# Patient Record
Sex: Female | Born: 1960 | Race: White | Hispanic: No | Marital: Married | Smoking: Former smoker
Health system: Southern US, Community
[De-identification: ages and names within clinical notes are randomized; demographics above are authoritative.]

## PROBLEM LIST (undated history)

## (undated) DIAGNOSIS — T7840XA Allergy, unspecified, initial encounter: Secondary | ICD-10-CM

## (undated) HISTORY — PX: INNER EAR SURGERY: SHX679

## (undated) HISTORY — DX: Allergy, unspecified, initial encounter: T78.40XA

## (undated) HISTORY — PX: DILATION AND CURETTAGE OF UTERUS: SHX78

---
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
## 2009-09-10 HISTORY — PX: COLONOSCOPY: SHX174

## 2009-09-10 HISTORY — PX: POLYPECTOMY: SHX149

## 2010-03-14 ENCOUNTER — Encounter (INDEPENDENT_AMBULATORY_CARE_PROVIDER_SITE_OTHER): Payer: Self-pay | Admitting: *Deleted

## 2010-03-22 ENCOUNTER — Encounter (INDEPENDENT_AMBULATORY_CARE_PROVIDER_SITE_OTHER): Payer: Self-pay | Admitting: *Deleted

## 2010-03-24 ENCOUNTER — Ambulatory Visit: Payer: Self-pay | Admitting: Internal Medicine

## 2010-03-31 ENCOUNTER — Ambulatory Visit: Payer: Self-pay | Admitting: Internal Medicine

## 2010-04-04 ENCOUNTER — Encounter: Payer: Self-pay | Admitting: Internal Medicine

## 2010-09-10 LAB — HM COLONOSCOPY

## 2010-10-10 NOTE — Letter (Signed)
Summary: Patient Notice- Polyp Results   Gastroenterology  [address] [place], [place] [address]   Phone: [phone]  Fax: [phone]        April 04, 2010 MRN: [id number]    [facility] [address] [place], [place]  [address]    Dear Ms. Clauson,  I am pleased to inform you that the colon polyp(s) removed during your recent colonoscopy was (were) found to be benign (no cancer detected) upon pathologic examination.The polyps are hyperplastic ( not precancerous)  I recommend you have a repeat colonoscopy examination in 10_ years to look for recurrent polyps, as having colon polyps increases your risk for having recurrent polyps or even colon cancer in the future.  Should you develop new or worsening symptoms of abdominal pain, bowel habit changes or bleeding from the rectum or bowels, please schedule an evaluation with either your primary care physician or with me.  Additional information/recommendations:  _x_ No further action with gastroenterology is needed at this time. Please      follow-up with your primary care physician for your other healthcare      needs.  __ Please call [phone] to schedule a return visit to review your      situation.  __ Please keep your follow-up visit as already scheduled.  __ Continue treatment plan as outlined the day of your exam.  Please call us if you are having persistent problems or have questions about your condition that have not been fully answered at this time.  Sincerely,  Hart Carwin MD  This letter has been electronically signed by your physician.  Appended Document: Patient Notice- Polyp Results letter mailed

## 2010-10-10 NOTE — Miscellaneous (Signed)
Summary: [facility]  Clinical Lists Changes  Medications: Added new medication of MOVIPREP 100 GM  SOLR (PEG-KCL-NACL-NASULF-NA ASC-C) As per prep instructions. - Signed Rx of MOVIPREP 100 GM  SOLR (PEG-KCL-NACL-NASULF-NA ASC-C) As per prep instructions.;  #1 x 0;  Signed;  Entered by: Ezra Sites RN;  Authorized by: Hart Carwin MD;  Method used: Electronically to [facility]  [phone]*, [address] [place], [place], [place]  [address], Ph: [phone] or [phone], Fax: [phone] Allergies: Added new allergy or adverse reaction of PCN Observations: Added new observation of NKA: F (03/24/2010 15:24)    Prescriptions: MOVIPREP 100 GM  SOLR (PEG-KCL-NACL-NASULF-NA ASC-C) As per prep instructions.  #1 x 0   Entered by:   Ezra Sites RN   Authorized by:   Hart Carwin MD   Signed by:   Ezra Sites RN on 03/24/2010   Method used:   Electronically to        [facility]  [phone]* (retail)       [address]       [place], [place]  [address]       Ph: [phone] or [phone]       Fax: [phone]   RxID:   [id number]

## 2010-10-10 NOTE — Letter (Signed)
Summary: Previsit letter  [facility]  [address] [place], [place] [address]   Phone: [phone]  Fax: [phone]       03/14/2010 MRN: [id number]  [facility] [address] [place], [place]  [address]  Dear Ms. Corney,  Welcome to the Gastroenterology Division at [facility].    You are scheduled to see a nurse for your pre-procedure visit on 03/24/2010 at 3:30pm on the 3rd floor at [facility], [address]  We ask that you try to arrive at our office 15 minutes prior to your appointment time to allow for check-in.  Your nurse visit will consist of discussing your medical and surgical history, your immediate family medical history, and your medications.    Please bring a complete list of all your medications or, if you prefer, bring the medication bottles and we will list them.  We will need to be aware of both prescribed and over the counter drugs.  We will need to know exact dosage information as well.  If you are on blood thinners (Coumadin, Plavix, Aggrenox, Ticlid, etc.) please call our office today/prior to your appointment, as we need to consult with your physician about holding your medication.   Please be prepared to read and sign documents such as consent forms, a financial agreement, and acknowledgement forms.  If necessary, and with your consent, a friend or relative is welcome to sit-in on the nurse visit with you.  Please bring your insurance card so that we may make a copy of it.  If your insurance requires a referral to see a specialist, please bring your referral form from your primary care physician.  No co-pay is required for this nurse visit.     If you cannot keep your appointment, please call [phone] to cancel or reschedule prior to your appointment date.  This allows [place] the opportunity to schedule an appointment for another patient in need of care.    Thank you for choosing [facility] for your medical  needs.  We appreciate the opportunity to care for you.  Please visit [place] at our website  to learn more about our practice.                     Sincerely.                                                                                                                   The Gastroenterology Division

## 2010-10-10 NOTE — Letter (Signed)
Summary: [facility] Instructions  [facility]  [address] [place], [place] [address]   Phone: [phone]  Fax: [phone]       Patricia Bryant    August 05, 1961    MRN: [id number]        Procedure Day /Date: Friday 03/31/2010     Arrival Time: 12:30 pm      Procedure Time: 1:30 pm     Location of Procedure:                    _x_  [facility] (4th Floor)                        PREPARATION FOR COLONOSCOPY WITH MOVIPREP   Starting 5 days prior to your procedure Sunday 7/17 do not eat nuts, seeds, popcorn, corn, beans, peas,  salads, or any raw vegetables.  Do not take any fiber supplements (e.g. Metamucil, Citrucel, and Benefiber).  THE DAY BEFORE YOUR PROCEDURE         DATE: Friday 7/21  1.  Drink clear liquids the entire day-NO SOLID FOOD  2.  Do not drink anything colored red or purple.  Avoid juices with pulp.  No orange juice.  3.  Drink at least 64 oz. (8 glasses) of fluid/clear liquids during the day to prevent dehydration and help the prep work efficiently.  CLEAR LIQUIDS INCLUDE: Water Jello Ice Popsicles Tea (sugar ok, no milk/cream) Powdered fruit flavored drinks Coffee (sugar ok, no milk/cream) Gatorade Juice: apple, white grape, white cranberry  Lemonade Clear bullion, consomm, broth Carbonated beverages (any kind) Strained chicken noodle soup Hard Candy                             4.  In the morning, mix first dose of MoviPrep solution:    Empty 1 Pouch A and 1 Pouch B into the disposable container    Add lukewarm drinking water to the top line of the container. Mix to dissolve    Refrigerate (mixed solution should be used within 24 hrs)  5.  Begin drinking the prep at 5:00 p.m. The MoviPrep container is divided by 4 marks.   Every 15 minutes drink the solution down to the next mark (approximately 8 oz) until the full liter is complete.   6.  Follow completed prep with 16 oz of clear liquid of your choice (Nothing  red or purple).  Continue to drink clear liquids until bedtime.  7.  Before going to bed, mix second dose of MoviPrep solution:    Empty 1 Pouch A and 1 Pouch B into the disposable container    Add lukewarm drinking water to the top line of the container. Mix to dissolve    Refrigerate  THE DAY OF YOUR PROCEDURE      DATE: Friday 7/22  Beginning at 8:30 a.m. (5 hours before procedure):         1. Every 15 minutes, drink the solution down to the next mark (approx 8 oz) until the full liter is complete.  2. Follow completed prep with 16 oz. of clear liquid of your choice.    3. You may drink clear liquids until 11:30 am (2 HOURS BEFORE PROCEDURE).   MEDICATION INSTRUCTIONS  Unless otherwise instructed, you should take regular prescription medications with a small sip of water   as early as possible the morning of your procedure.  OTHER INSTRUCTIONS  You will need a responsible adult at least 52 years of age to accompany you and drive you home.   This person must remain in the waiting room during your procedure.  Wear loose fitting clothing that is easily removed.  Leave jewelry and other valuables at home.  However, you may wish to bring a book to read or  an iPod/MP3 player to listen to music as you wait for your procedure to start.  Remove all body piercing jewelry and leave at home.  Total time from sign-in until discharge is approximately 2-3 hours.  You should go home directly after your procedure and rest.  You can resume normal activities the  day after your procedure.  The day of your procedure you should not:   Drive   Make legal decisions   Operate machinery   Drink alcohol   Return to work  You will receive specific instructions about eating, activities and medications before you leave.    The above instructions have been reviewed and explained to me by   Ezra Sites RN  March 24, 2010 3:46 PM    I fully understand and can verbalize  these instructions _____________________________ Date _________

## 2010-10-10 NOTE — Procedures (Signed)
Summary: Colonoscopy  Patient: Patricia Bryant Note: All result statuses are Final unless otherwise noted.  Tests: (1) Colonoscopy (COL)   COL Colonoscopy           DONE     [facility]     [address]     [place], [place]  [address]           COLONOSCOPY PROCEDURE REPORT           PATIENT:  Patricia Bryant, Patricia Bryant  MR#:  [id number]     BIRTHDATE:  Mar 19, 1961, 49 yrs. old  GENDER:  female     ENDOSCOPIST:  Hedwig Morton. Juanda Chance, MD     REF. BY: [email or web address],[place], [place]     PROCEDURE DATE:  03/31/2010     PROCEDURE:  Colonoscopy [address]     ASA CLASS:  Class I     INDICATIONS:  Routine Risk Screening     MEDICATIONS:   Versed 7 mg, Fentanyl 75 mcg           DESCRIPTION OF PROCEDURE:   After the risks benefits and     alternatives of the procedure were thoroughly explained, informed     consent was obtained.  Digital rectal exam was performed and     revealed no rectal masses.   The LB CF-[id number] endoscope     was introduced through the anus and advanced to the cecum, which     was identified by both the appendix and ileocecal valve, without     limitations.  The quality of the prep was excellent, using     MoviPrep.  The instrument was then slowly withdrawn as the colon     was fully examined.     <<PROCEDUREIMAGES>>           FINDINGS:  Two polyps were found. at 5 and 20 cm, 2-3 mm polyps     removed The polyps were removed using cold biopsy forceps (see     image1 and image6).  This was otherwise a normal examination of     the colon (see image7, image5, image4, image3, and image2).     Retroflexed views in the rectum revealed no abnormalities.    The     scope was then withdrawn from the patient and the procedure     completed.           COMPLICATIONS:  None     ENDOSCOPIC IMPRESSION:     1) Two polyps     2) Otherwise normal examination     RECOMMENDATIONS:     1) Await pathology results     2) High fiber diet.     REPEAT EXAM:  In 10 year(s) for.  or 5 years if  polyps adenomatous           ______________________________     Hedwig Morton. Juanda Chance, MD           CC:           n.     eSIGNED:   Hedwig Morton. Brodie at 03/31/2010 02:06 PM           Fransico Him, [phone]  Note: An exclamation mark (!) indicates a result that was not dispersed into the flowsheet. Document Creation Date: 03/31/2010 2:07 PM _______________________________________________________________________  (1) Order result status: Final Collection or observation date-time: 03/31/2010 13:56 Requested date-time:  Receipt date-time:  Reported date-time:  Referring Physician:   Ordering Physician: Lina Sar [id number]) Specimen Source:  Source: Kem Parkinson  Filler Order Number: [id number] Lab site:   Appended Document: Colonoscopy     Procedures Next Due Date:    Colonoscopy: 04/2020

## 2011-10-12 LAB — HM PAP SMEAR

## 2012-06-29 ENCOUNTER — Encounter (HOSPITAL_COMMUNITY): Payer: Self-pay | Admitting: *Deleted

## 2012-06-29 ENCOUNTER — Emergency Department (HOSPITAL_COMMUNITY)
Admission: EM | Admit: 2012-06-29 | Discharge: 2012-06-29 | Disposition: A | Discharge status: Home / Self Care | Payer: BC Managed Care – PPO | Attending: Emergency Medicine | Admitting: Emergency Medicine

## 2012-06-29 DIAGNOSIS — L509 Urticaria, unspecified: Secondary | ICD-10-CM | POA: Insufficient documentation

## 2012-06-29 DIAGNOSIS — Z87891 Personal history of nicotine dependence: Secondary | ICD-10-CM | POA: Insufficient documentation

## 2012-06-29 MED ORDER — FAMOTIDINE 20 MG PO TABS: 10.0000 mg | ORAL_TABLET | Freq: Two times a day (BID) | ORAL | Status: DC

## 2012-06-29 MED ORDER — PREDNISONE 50 MG PO TABS: 50.0000 mg | ORAL_TABLET | Freq: Every day | ORAL | Status: DC

## 2012-06-29 MED ORDER — HYDROXYZINE HCL 25 MG PO TABS: 25.0000 mg | ORAL_TABLET | Freq: Four times a day (QID) | ORAL | Status: DC

## 2012-06-29 MED ORDER — DEXAMETHASONE SODIUM PHOSPHATE 10 MG/ML IJ SOLN
10.0000 mg | Freq: Once | INTRAMUSCULAR | Status: AC
  Administered 2012-06-29: 10 mg via INTRAMUSCULAR
  Filled 2012-06-29: qty 1

## 2012-06-29 NOTE — ED Notes (Addendum)
Pt stayed at [employer] on vacation. Came home on Tuesday and noticed bumps on her back. The bumps itch and since then have been spreading. Start out small about eraser head size and grow to nickle size. Pt has bumps on back, legs, arms, and face. Pt denies pain and denies SOB. Reports she worked out this morning and had no difficulty. Pt has tried hydrocortisone cream with no relief. Took 1 benadryl tablet at 0600

## 2012-07-06 NOTE — ED Provider Notes (Signed)
History     CSN: [id number]  Arrival date & time 06/29/12  1023   First MD Initiated Contact with Patient 06/29/12 1100      Chief Complaint  Patient presents with  . Urticaria    (Consider location/radiation/quality/duration/timing/severity/associated sxs/prior treatment) HPI Patient presents to the ER with hives that began 2 days ago. The patient states that they started on her arms and now involved most of her body. The patient states that they slept at a hotel and the next day these hive like areas developed. The patient states that she did not take anything prior to arrival. The patient denies chest pain, SOB, nausea, vomiting, weakness, headache, wheezing, fever, or cough. The patient states that she has not started any new medications or had new exposures to perfume, lotions, soaps or detergents. Patient states that she does have itching associated with the hives. History reviewed. No pertinent past medical history.  Past Surgical History  Procedure Date  . Inner ear surgery     History reviewed. No pertinent family history.  History  Substance Use Topics  . Smoking status: Former Smoker -- 0.5 packs/day for 15 years    Types: Cigarettes  . Smokeless tobacco: Not on file  . Alcohol Use: Yes    OB History    Grav Para Term Preterm Abortions TAB SAB Ect Mult Living                  Review of Systems All other systems negative except as documented in the HPI. All pertinent positives and negatives as reviewed in the HPI.  Allergies  Penicillins  Home Medications   Current Outpatient Rx  Name Route Sig Dispense Refill  . FAMOTIDINE 20 MG PO TABS Oral Take 0.5 tablets (10 mg total) by mouth 2 (two) times daily. 30 tablet 0  . HYDROXYZINE HCL 25 MG PO TABS Oral Take 1 tablet (25 mg total) by mouth every 6 (six) hours. 12 tablet 0  . PREDNISONE 50 MG PO TABS Oral Take 1 tablet (50 mg total) by mouth daily. 7 tablet 0    BP 163/100  Pulse 81  Temp 98 F (36.7  C) (Oral)  Resp 16  SpO2 100%  Physical Exam  Nursing note and vitals reviewed. Constitutional: She is oriented to person, place, and time. She appears well-developed and well-nourished. No distress.  HENT:  Head: Normocephalic and atraumatic.  Mouth/Throat: Oropharynx is clear and moist.  Neck: Normal range of motion. Neck supple.  Cardiovascular: Normal rate, regular rhythm and normal heart sounds.   Pulmonary/Chest: Effort normal and breath sounds normal. No respiratory distress. She has no wheezes.  Neurological: She is alert and oriented to person, place, and time.  Skin: Skin is warm and dry. No erythema.       Patient has hives to arms, chest, abdomen back and legs.     ED Course  Procedures (including critical care time)  Labs Reviewed - No data to display No results found.   1. Hives     Will be treated for hives and allergic reaction. The patient exposure could have been the detergent used on the bedding or towels at the hotel. Told to return here as needed. Follow up with her PCP. Given steroids and told to use benadryl as well.   MDM          Carlyle Dolly, PA-C 07/06/12 1528

## 2012-07-13 NOTE — ED Provider Notes (Signed)
Medical screening examination/treatment/procedure(s) were performed by non-physician practitioner and as supervising physician I was immediately available for consultation/collaboration.  Hurman Horn, MD 07/13/12 [phone]

## 2013-03-24 ENCOUNTER — Ambulatory Visit (INDEPENDENT_AMBULATORY_CARE_PROVIDER_SITE_OTHER): Payer: Managed Care, Other (non HMO) | Admitting: Internal Medicine

## 2013-03-24 ENCOUNTER — Encounter: Payer: Self-pay | Admitting: Internal Medicine

## 2013-03-24 ENCOUNTER — Other Ambulatory Visit (INDEPENDENT_AMBULATORY_CARE_PROVIDER_SITE_OTHER): Payer: Managed Care, Other (non HMO)

## 2013-03-24 VITALS — BP 118/82 | HR 61 | Temp 97.0°F | Ht 64.5 in | Wt 125.0 lb

## 2013-03-24 DIAGNOSIS — Z130 Encounter for screening for diseases of the blood and blood-forming organs and certain disorders involving the immune mechanism: Secondary | ICD-10-CM

## 2013-03-24 DIAGNOSIS — Z13220 Encounter for screening for lipoid disorders: Secondary | ICD-10-CM

## 2013-03-24 DIAGNOSIS — Z1239 Encounter for other screening for malignant neoplasm of breast: Secondary | ICD-10-CM

## 2013-03-24 DIAGNOSIS — Z0000 Encounter for general adult medical examination without abnormal findings: Secondary | ICD-10-CM

## 2013-03-24 DIAGNOSIS — Z1321 Encounter for screening for nutritional disorder: Secondary | ICD-10-CM

## 2013-03-24 DIAGNOSIS — Z1329 Encounter for screening for other suspected endocrine disorder: Secondary | ICD-10-CM

## 2013-03-24 DIAGNOSIS — Z131 Encounter for screening for diabetes mellitus: Secondary | ICD-10-CM

## 2013-03-24 DIAGNOSIS — R7989 Other specified abnormal findings of blood chemistry: Secondary | ICD-10-CM

## 2013-03-24 LAB — COMPREHENSIVE METABOLIC PANEL
AST: 24 U/L (ref 0–37)
Albumin: 4.5 g/dL (ref 3.5–5.2)
Alkaline Phosphatase: 69 U/L (ref 39–117)
BUN: 14 mg/dL (ref 6–23)
Glucose, Bld: 87 mg/dL (ref 70–99)
Potassium: 4.7 mEq/L (ref 3.5–5.1)
Total Bilirubin: 1.3 mg/dL — ABNORMAL HIGH (ref 0.3–1.2)

## 2013-03-24 LAB — CBC
HCT: 39.6 % (ref 36.0–46.0)
MCHC: 33.8 g/dL (ref 30.0–36.0)
MCV: 90.4 fl (ref 78.0–100.0)
Platelets: 261 10*3/uL (ref 150.0–400.0)
RBC: 4.38 Mil/uL (ref 3.87–5.11)

## 2013-03-24 LAB — LIPID PANEL
Cholesterol: 234 mg/dL — ABNORMAL HIGH (ref 0–200)
HDL: 105.3 mg/dL (ref >39.00)
Total CHOL/HDL Ratio: 2
Triglycerides: 36 mg/dL (ref 0.0–149.0)
VLDL: 7.2 mg/dL (ref 0.0–40.0)

## 2013-03-24 NOTE — Patient Instructions (Signed)

## 2013-03-24 NOTE — Progress Notes (Signed)
HPI  Pt presents to the clinic today to establish care. She in transferring care from her PCP in [place]. She has no concerns today.  Flu: 06/2012 Tetanus: 2012 Mammogram: 2013 Pap smear: 2013 LMP: post menopausal Eye doctor: as needed Dentist: yearly  Past Medical History  Diagnosis Date  . Allergy     No current outpatient prescriptions on file.   No current facility-administered medications for this visit.    Allergies  Allergen Reactions  . Penicillins     REACTION: hives, ankles swell    Family History  Problem Relation Age of Onset  . Colon cancer Father   . Heart attack Sister   . Heart disease Sister     History   Social History  . Marital Status: Married    Spouse Name: N/A    Number of Children: N/A  . Years of Education: 16   Occupational History  . Manager    Social History Main Topics  . Smoking status: Former Smoker -- 0.50 packs/day for 15 years    Types: Cigarettes  . Smokeless tobacco: Never Used  . Alcohol Use: Yes  . Drug Use: No  . Sexually Active: Yes   Other Topics Concern  . Not on file   Social History Narrative   Regular exercise-yes   Caffeine use-no    ROS:  Constitutional: Denies fever, malaise, fatigue, headache or abrupt weight changes.  HEENT: Denies eye pain, eye redness, ear pain, ringing in the ears, wax buildup, runny nose, nasal congestion, bloody nose, or sore throat. Respiratory: Denies difficulty breathing, shortness of breath, cough or sputum production.   Cardiovascular: Denies chest pain, chest tightness, palpitations or swelling in the hands or feet.  Gastrointestinal: Denies abdominal pain, bloating, constipation, diarrhea or blood in the stool.  GU: Denies frequency, urgency, pain with urination, blood in urine, odor or discharge. Musculoskeletal: Denies decrease in range of motion, difficulty with gait, muscle pain or joint pain and swelling.  Skin: Denies redness, rashes, lesions or ulcercations.   Neurological: Denies dizziness, difficulty with memory, difficulty with speech or problems with balance and coordination.   No other specific complaints in a complete review of systems (except as listed in HPI above).  PE:  BP 118/82  Pulse 61  Temp(Src) 97 F (36.1 C) (Oral)  Ht 5' 4.5" (1.638 m)  Wt 125 lb (56.7 kg)  BMI 21.13 kg/m2  SpO2 96% Wt Readings from Last 3 Encounters:  03/24/13 125 lb (56.7 kg)    General: Appears her stated age, well developed, well nourished in NAD. HEENT: Head: normal shape and size; Eyes: sclera white, no icterus, conjunctiva pink, PERRLA and EOMs intact; Ears: Tm's gray and intact, normal light reflex; Nose: mucosa pink and moist, septum midline; Throat/Mouth: Teeth present, mucosa pink and moist, no lesions or ulcerations noted.  Neck: Normal range of motion. Neck supple, trachea midline. No massses, lumps or thyromegaly present.  Cardiovascular: Normal rate and rhythm. S1,S2 noted.  No murmur, rubs or gallops noted. No JVD or BLE edema. No carotid bruits noted. Pulmonary/Chest: Normal effort and positive vesicular breath sounds. No respiratory distress. No wheezes, rales or ronchi noted.  Abdomen: Soft and nontender. Normal bowel sounds, no bruits noted. No distention or masses noted. Liver, spleen and kidneys non palpable. Musculoskeletal: Normal range of motion. No signs of joint swelling. No difficulty with gait.  Neurological: Alert and oriented. Cranial nerves II-XII intact. Coordination normal. +DTRs bilaterally. Psychiatric: Mood and affect normal. Behavior is normal. Judgment and  thought content normal.      Assessment and Plan:  Preventative Health Maintenance:  Will obtain labs today Will set up mammogram Set up your pap smear with me  RTC in 1 year or sooner

## 2013-03-25 LAB — VITAMIN D 25 HYDROXY (VIT D DEFICIENCY, FRACTURES): Vit D, 25-Hydroxy: 53 ng/mL (ref 30–89)

## 2013-06-08 ENCOUNTER — Other Ambulatory Visit (HOSPITAL_COMMUNITY): Payer: Self-pay | Admitting: Dietician

## 2013-06-08 DIAGNOSIS — Z1231 Encounter for screening mammogram for malignant neoplasm of breast: Secondary | ICD-10-CM

## 2013-07-02 ENCOUNTER — Ambulatory Visit (HOSPITAL_COMMUNITY): Payer: Managed Care, Other (non HMO)

## 2013-07-16 ENCOUNTER — Other Ambulatory Visit: Payer: Self-pay

## 2013-07-21 ENCOUNTER — Ambulatory Visit (HOSPITAL_COMMUNITY)
Admission: RE | Admit: 2013-07-21 | Discharge: 2013-07-21 | Disposition: A | Discharge status: Home / Self Care | Payer: Managed Care, Other (non HMO) | Source: Ambulatory Visit | Attending: Internal Medicine | Admitting: Internal Medicine

## 2013-07-21 DIAGNOSIS — Z1239 Encounter for other screening for malignant neoplasm of breast: Secondary | ICD-10-CM

## 2013-07-21 DIAGNOSIS — Z1231 Encounter for screening mammogram for malignant neoplasm of breast: Secondary | ICD-10-CM | POA: Insufficient documentation

## 2014-01-08 ENCOUNTER — Other Ambulatory Visit (HOSPITAL_COMMUNITY)
Admission: RE | Admit: 2014-01-08 | Discharge: 2014-01-08 | Disposition: A | Discharge status: Home / Self Care | Payer: Managed Care, Other (non HMO) | Source: Ambulatory Visit | Attending: Internal Medicine | Admitting: Internal Medicine

## 2014-01-08 ENCOUNTER — Encounter: Payer: Self-pay | Admitting: Internal Medicine

## 2014-01-08 ENCOUNTER — Ambulatory Visit (INDEPENDENT_AMBULATORY_CARE_PROVIDER_SITE_OTHER): Payer: Managed Care, Other (non HMO) | Admitting: Internal Medicine

## 2014-01-08 VITALS — BP 104/68 | HR 74 | Temp 97.9°F | Ht 63.75 in | Wt 126.0 lb

## 2014-01-08 DIAGNOSIS — Z0000 Encounter for general adult medical examination without abnormal findings: Secondary | ICD-10-CM

## 2014-01-08 DIAGNOSIS — Z124 Encounter for screening for malignant neoplasm of cervix: Secondary | ICD-10-CM

## 2014-01-08 DIAGNOSIS — R87810 Cervical high risk human papillomavirus (HPV) DNA test positive: Secondary | ICD-10-CM | POA: Insufficient documentation

## 2014-01-08 DIAGNOSIS — Z1151 Encounter for screening for human papillomavirus (HPV): Secondary | ICD-10-CM | POA: Insufficient documentation

## 2014-01-08 LAB — COMPREHENSIVE METABOLIC PANEL
ALBUMIN: 4.7 g/dL (ref 3.5–5.2)
ALK PHOS: 69 U/L (ref 39–117)
ALT: 19 U/L (ref 0–35)
AST: 23 U/L (ref 0–37)
BUN: 14 mg/dL (ref 6–23)
CO2: 29 mEq/L (ref 19–32)
Calcium: 10.1 mg/dL (ref 8.4–10.5)
Chloride: 103 mEq/L (ref 96–112)
Creatinine, Ser: 0.8 mg/dL (ref 0.4–1.2)
GFR: 76.89 mL/min (ref >60.00)
Glucose, Bld: 87 mg/dL (ref 70–99)
POTASSIUM: 5.2 meq/L — AB (ref 3.5–5.1)
SODIUM: 140 meq/L (ref 135–145)
TOTAL PROTEIN: 7.6 g/dL (ref 6.0–8.3)
Total Bilirubin: 1.1 mg/dL (ref 0.3–1.2)

## 2014-01-08 LAB — HEMOGLOBIN A1C: HEMOGLOBIN A1C: 5.4 % (ref 4.6–6.5)

## 2014-01-08 LAB — LIPID PANEL
CHOL/HDL RATIO: 2
CHOLESTEROL: 232 mg/dL — AB (ref 0–200)
HDL: 117.5 mg/dL (ref >39.00)
LDL CALC: 100 mg/dL — AB (ref 0–99)
Triglycerides: 73 mg/dL (ref 0.0–149.0)
VLDL: 14.6 mg/dL (ref 0.0–40.0)

## 2014-01-08 LAB — TSH: TSH: 0.86 u[IU]/mL (ref 0.35–5.50)

## 2014-01-08 LAB — CBC
HCT: 41 % (ref 36.0–46.0)
Hemoglobin: 13.7 g/dL (ref 12.0–15.0)
MCHC: 33.3 g/dL (ref 30.0–36.0)
MCV: 89 fl (ref 78.0–100.0)
Platelets: 259 10*3/uL (ref 150.0–400.0)
RBC: 4.61 Mil/uL (ref 3.87–5.11)
RDW: 14.2 % (ref 11.5–14.6)
WBC: 5.6 10*3/uL (ref 4.5–10.5)

## 2014-01-08 NOTE — Patient Instructions (Addendum)

## 2014-01-08 NOTE — Progress Notes (Signed)
Subjective:    Patient ID: Patricia Bryant, female    DOB: 06/20/1961, 53 y.o.   MRN: [id number]  HPI  Pt presents to the clinic today for her annual physical exam and pap smear. She has no concerns today.  Flu: 10/13 Tetanus: 2012 LMP: post menopausal Pap Smear: 2013 Mammogram: 07/2013 Colon Screening: 2012 Eye Doctor: yearly Dentist: biannually  Review of Systems      Past Medical History  Diagnosis Date  . Allergy     Current Outpatient Prescriptions  Medication Sig Dispense Refill  . Multiple Vitamin (MULTIVITAMIN) tablet Take 1 tablet by mouth daily.       No current facility-administered medications for this visit.    Allergies  Allergen Reactions  . Penicillins     REACTION: hives, ankles swell    Family History  Problem Relation Age of Onset  . Colon cancer Father   . Heart attack Sister   . Heart disease Sister     History   Social History  . Marital Status: Married    Spouse Name: N/A    Number of Children: N/A  . Years of Education: 16   Occupational History  . Manager    Social History Main Topics  . Smoking status: Former Smoker -- 0.50 packs/day for 15 years    Types: Cigarettes  . Smokeless tobacco: Never Used  . Alcohol Use: Yes     Comment: occasional  . Drug Use: No  . Sexual Activity: Yes   Other Topics Concern  . Not on file   Social History Narrative   Regular exercise-yes   Caffeine use-no     Constitutional: Denies fever, malaise, fatigue, headache or abrupt weight changes.  HEENT: Denies eye pain, eye redness, ear pain, ringing in the ears, wax buildup, runny nose, nasal congestion, bloody nose, or sore throat. Respiratory: Denies difficulty breathing, shortness of breath, cough or sputum production.   Cardiovascular: Denies chest pain, chest tightness, palpitations or swelling in the hands or feet.  Gastrointestinal: Denies abdominal pain, bloating, constipation, diarrhea or blood in the stool.  GU: Denies  urgency, frequency, pain with urination, burning sensation, blood in urine, odor or discharge. Musculoskeletal: Denies decrease in range of motion, difficulty with gait, muscle pain or joint pain and swelling.  Skin: Denies redness, rashes, lesions or ulcercations.  Neurological: Denies dizziness, difficulty with memory, difficulty with speech or problems with balance and coordination.   No other specific complaints in a complete review of systems (except as listed in HPI above).  Objective:   Physical Exam   BP 104/68  Pulse 74  Temp(Src) 97.9 F (36.6 C) (Oral)  Ht 5' 3.75" (1.619 m)  Wt 126 lb (57.153 kg)  BMI 21.80 kg/m2  SpO2 98% Wt Readings from Last 3 Encounters:  01/08/14 126 lb (57.153 kg)  03/24/13 125 lb (56.7 kg)    Constitutional:  Alert, oriented x 4, well developed, well nourished in no apparent distress. Skin: Skin is warm and dry.  No erythema, lesion or ulceration noted. HEENT: Head: normal shape and size; Eyes: sclera white, no icterus, conjunctiva pink, PERRLA and EOMs intact; Ears: Tm's gray and intact, normal light reflex; Nose: mucosa pink and moist, septum midline; Throat/Mouth: Teeth present, , mucosa pink and moist, no lesions or ulcerations noted. Neck: Normal range of motion. Neck supple, trachea midline. No massses, lumps present. Thyromegaly noted. Cardiovascular: Normal rate and rhythm. S1,S2 noted.  No murmur, rubs or gallops noted. No JVD or BLE edema. No  carotid bruits noted. Pulmonary/Chest: Normal effort and positive vesicular breath sounds. No respiratory distress. No wheezes, rales or ronchi noted.  Abdomen: Soft and nontender. Normal bowel sounds, no bruits noted. No distention or masses noted. Liver, spleen and kidneys non palpable. Genitourinary: Normal female anatomy. Uterus midline, anterior and soft. No CMT or discharge noted. Adenexa non palpable. Breast with fibrocystic changes, no masses.  Musculoskeletal: Normal range of motion. Patient  exhibits no effusions.  Neurological: Alert and oriented. Cranial nerves II-XII intact. Coordination normal. +DTRs bilaterally. Psychiatric: She has a normal mood and affect. Behavior is normal. Judgment and thought content normal.    BMET    Component Value Date/Time   NA 140 03/24/2013 1528   K 4.7 03/24/2013 1528   CL 102 03/24/2013 1528   CO2 32 03/24/2013 1528   GLUCOSE 87 03/24/2013 1528   BUN 14 03/24/2013 1528   CREATININE 0.8 03/24/2013 1528   CALCIUM 10.1 03/24/2013 1528    Lipid Panel     Component Value Date/Time   CHOL 234* 03/24/2013 1528   TRIG 36.0 03/24/2013 1528   HDL 105.30 03/24/2013 1528   CHOLHDL 2 03/24/2013 1528   VLDL 7.2 03/24/2013 1528    CBC    Component Value Date/Time   WBC 4.8 03/24/2013 1528   RBC 4.38 03/24/2013 1528   HGB 13.4 03/24/2013 1528   HCT 39.6 03/24/2013 1528   PLT 261.0 03/24/2013 1528   MCV 90.4 03/24/2013 1528   MCHC 33.8 03/24/2013 1528   RDW 13.6 03/24/2013 1528    Hgb A1C Lab Results  Component Value Date   HGBA1C 5.8 03/24/2013        Assessment & Plan:   Preventative Health Maintenance:  All HM UTD Will obtain screening labs today Pap smear obtained-will call you with the results  RTC in 1 year or sooner if needed

## 2014-01-08 NOTE — Addendum Note (Signed)
Addended by: Littie DeedsEVONTENNO, Amra Shukla Y on: 01/08/2014 10:00 AM   Modules accepted: Orders

## 2014-01-18 ENCOUNTER — Encounter: Payer: Self-pay | Admitting: Internal Medicine

## 2015-01-14 ENCOUNTER — Encounter: Payer: Self-pay | Admitting: Internal Medicine

## 2015-01-14 ENCOUNTER — Other Ambulatory Visit (HOSPITAL_COMMUNITY)
Admission: RE | Admit: 2015-01-14 | Discharge: 2015-01-14 | Disposition: A | Discharge status: Home / Self Care | Payer: Managed Care, Other (non HMO) | Source: Ambulatory Visit | Attending: Internal Medicine | Admitting: Internal Medicine

## 2015-01-14 ENCOUNTER — Ambulatory Visit (INDEPENDENT_AMBULATORY_CARE_PROVIDER_SITE_OTHER): Payer: Managed Care, Other (non HMO) | Admitting: Internal Medicine

## 2015-01-14 VITALS — BP 120/80 | HR 70 | Temp 97.8°F | Ht 63.75 in | Wt 127.5 lb

## 2015-01-14 DIAGNOSIS — Z124 Encounter for screening for malignant neoplasm of cervix: Secondary | ICD-10-CM

## 2015-01-14 DIAGNOSIS — Z0000 Encounter for general adult medical examination without abnormal findings: Secondary | ICD-10-CM

## 2015-01-14 DIAGNOSIS — Z01419 Encounter for gynecological examination (general) (routine) without abnormal findings: Secondary | ICD-10-CM | POA: Diagnosis present

## 2015-01-14 LAB — COMPREHENSIVE METABOLIC PANEL
ALT: 12 U/L (ref 0–35)
AST: 16 U/L (ref 0–37)
Albumin: 4.4 g/dL (ref 3.5–5.2)
Alkaline Phosphatase: 77 U/L (ref 39–117)
BILIRUBIN TOTAL: 0.9 mg/dL (ref 0.2–1.2)
BUN: 6 mg/dL (ref 6–23)
CALCIUM: 9.7 mg/dL (ref 8.4–10.5)
CO2: 35 mEq/L — ABNORMAL HIGH (ref 19–32)
CREATININE: 0.74 mg/dL (ref 0.40–1.20)
Chloride: 101 mEq/L (ref 96–112)
GFR: 86.24 mL/min (ref >60.00)
Glucose, Bld: 86 mg/dL (ref 70–99)
Potassium: 4.8 mEq/L (ref 3.5–5.1)
Sodium: 138 mEq/L (ref 135–145)
Total Protein: 7.4 g/dL (ref 6.0–8.3)

## 2015-01-14 LAB — LIPID PANEL
CHOLESTEROL: 220 mg/dL — AB (ref 0–200)
HDL: 121 mg/dL (ref >39.00)
LDL Cholesterol: 84 mg/dL (ref 0–99)
NonHDL: 99
Total CHOL/HDL Ratio: 2
Triglycerides: 76 mg/dL (ref 0.0–149.0)
VLDL: 15.2 mg/dL (ref 0.0–40.0)

## 2015-01-14 LAB — CBC
HCT: 38.4 % (ref 36.0–46.0)
Hemoglobin: 13.1 g/dL (ref 12.0–15.0)
MCHC: 34.1 g/dL (ref 30.0–36.0)
MCV: 85.9 fl (ref 78.0–100.0)
Platelets: 245 10*3/uL (ref 150.0–400.0)
RBC: 4.47 Mil/uL (ref 3.87–5.11)
RDW: 14.1 % (ref 11.5–15.5)
WBC: 3.7 10*3/uL — AB (ref 4.0–10.5)

## 2015-01-14 NOTE — Addendum Note (Signed)
Addended by: Lorre MunroeBAITY, REGINA W on: 01/14/2015 03:01 PM   Modules accepted: Kipp BroodSmartSet

## 2015-01-14 NOTE — Progress Notes (Signed)
Pre visit review using our clinic review tool, if applicable. No additional management support is needed unless otherwise documented below in the visit note. 

## 2015-01-14 NOTE — Patient Instructions (Signed)

## 2015-01-14 NOTE — Progress Notes (Signed)
Subjective:    Patient ID: Patricia Bryant, female    DOB: 10/02/1960, 54 y.o.   MRN: [id number]  HPI  Pt presents to the clinic today for her annual exam.  Flu: 2014 Tetanus: 2012 Pap Smear: 01/2014, normal but HPV detected Mammogram: 07/2013, she will call to schedule Colon Screening: 2012, normal Vision Screening: yearly Dentist: biannually  Diet: She is consuming a vegan diet, she does consume some cheese. She is taking a B12 supplement. Exercise: not currently exercising but was doing [place] about 2 months ago  Review of Systems      Past Medical History  Diagnosis Date  . Allergy     Current Outpatient Prescriptions  Medication Sig Dispense Refill  . Multiple Vitamin (MULTIVITAMIN) tablet Take 1 tablet by mouth daily.     No current facility-administered medications for this visit.    Allergies  Allergen Reactions  . Penicillins     REACTION: hives, ankles swell    Family History  Problem Relation Age of Onset  . Colon cancer Father   . Heart attack Sister   . Heart disease Sister     History   Social History  . Marital Status: Married    Spouse Name: N/A  . Number of Children: N/A  . Years of Education: 16   Occupational History  . Manager    Social History Main Topics  . Smoking status: Former Smoker -- 0.50 packs/day for 15 years    Types: Cigarettes  . Smokeless tobacco: Never Used  . Alcohol Use: Yes     Comment: occasional  . Drug Use: No  . Sexual Activity: Yes   Other Topics Concern  . Not on file   Social History Narrative   Regular exercise-yes   Caffeine use-no     Constitutional: Denies fever, malaise, fatigue, headache or abrupt weight changes.  HEENT: Denies eye pain, eye redness, ear pain, ringing in the ears, wax buildup, runny nose, nasal congestion, bloody nose, or sore throat. Respiratory: Denies difficulty breathing, shortness of breath, cough or sputum production.   Cardiovascular: Denies chest pain, chest  tightness, palpitations or swelling in the hands or feet.  Gastrointestinal: Denies abdominal pain, bloating, constipation, diarrhea or blood in the stool.  GU: Denies urgency, frequency, pain with urination, burning sensation, blood in urine, odor or discharge. Musculoskeletal: Denies decrease in range of motion, difficulty with gait, muscle pain or joint pain and swelling.  Skin: Denies redness, rashes, lesions or ulcercations.  Neurological: Denies dizziness, difficulty with memory, difficulty with speech or problems with balance and coordination.  Psych: Denies anxiety, depression, SI/HI.  No other specific complaints in a complete review of systems (except as listed in HPI above).   Objective:   Physical Exam  BP 120/80 mmHg  Pulse 70  Temp(Src) 97.8 F (36.6 C) (Oral)  Ht 5' 3.75" (1.619 m)  Wt 127 lb 8 oz (57.834 kg)  BMI 22.06 kg/m2 Wt Readings from Last 3 Encounters:  01/14/15 127 lb 8 oz (57.834 kg)  01/08/14 126 lb (57.153 kg)  03/24/13 125 lb (56.7 kg)    Constitutional:  Alert, oriented x 4, well developed, well nourished in no apparent distress. Skin: Skin is warm and dry.  No erythema, lesion or ulceration noted. HEENT: Head: normal shape and size; Eyes: sclera white, no icterus, conjunctiva pink, PERRLA and EOMs intact; Ears: Tm's gray and intact, normal light reflex; Throat/Mouth: Teeth present, mucosa pink and moist, no lesions or ulcerations noted. Neck: Neck supple, trachea  midline. No massses, lumps or thyromegaly present.  Cardiovascular: Normal rate and rhythm. S1,S2 noted.  No murmur, rubs or gallops noted. No JVD or BLE edema. No carotid bruits noted. Pulmonary/Chest: Normal effort and positive vesicular breath sounds. No respiratory distress. No wheezes, rales or ronchi noted.  Abdomen: Soft and nontender. Normal bowel sounds, no bruits noted. No distention or masses noted. Liver, spleen and kidneys non palpable. Genitourinary: Normal female anatomy. Uterus  midline, anterior and soft. No CMT or discharge noted. Adenexa non palpable. Breast without lumps or masses.  Musculoskeletal: Normal range of motion. Strength 5/5 BUE/BLE.  Neurological: Alert and oriented. Cranial nerves II-XII grossly intact. Coordination normal.  Psychiatric: She has a normal mood and affect. Behavior is normal. Judgment and thought content normal.    BMET    Component Value Date/Time   NA 140 01/08/2014 0845   K 5.2* 01/08/2014 0845   CL 103 01/08/2014 0845   CO2 29 01/08/2014 0845   GLUCOSE 87 01/08/2014 0845   BUN 14 01/08/2014 0845   CREATININE 0.8 01/08/2014 0845   CALCIUM 10.1 01/08/2014 0845    Lipid Panel     Component Value Date/Time   CHOL 232* 01/08/2014 0845   TRIG 73.0 01/08/2014 0845   HDL 117.50 01/08/2014 0845   CHOLHDL 2 01/08/2014 0845   VLDL 14.6 01/08/2014 0845   LDLCALC 100* 01/08/2014 0845    CBC    Component Value Date/Time   WBC 5.6 01/08/2014 0845   RBC 4.61 01/08/2014 0845   HGB 13.7 01/08/2014 0845   HCT 41.0 01/08/2014 0845   PLT 259.0 01/08/2014 0845   MCV 89.0 01/08/2014 0845   MCHC 33.3 01/08/2014 0845   RDW 14.2 01/08/2014 0845    Hgb A1C Lab Results  Component Value Date   HGBA1C 5.4 01/08/2014        Assessment & Plan:   Preventative Health Maintenance:  Encouraged her to keep up with the diet and exercise Pap smear today- will call you with the results She will call and schedule her mammogram We will give her a flu shot in the fall Will check CBC, CMET and Lipid Profile Will obtain ECG- normal Not due yet for repeat colon screening  RTC in 1 year or sooner if needed

## 2015-01-14 NOTE — Addendum Note (Signed)
Addended by: Damita LackLORING, DONNA S on: 01/14/2015 03:02 PM   Modules accepted: Orders

## 2015-01-18 LAB — CYTOLOGY - PAP

## 2015-01-20 ENCOUNTER — Encounter: Payer: Managed Care, Other (non HMO) | Admitting: Internal Medicine

## 2015-02-21 ENCOUNTER — Other Ambulatory Visit (HOSPITAL_COMMUNITY): Payer: Self-pay | Admitting: Nurse Practitioner

## 2015-02-21 ENCOUNTER — Other Ambulatory Visit: Payer: Self-pay | Admitting: Internal Medicine

## 2015-02-21 DIAGNOSIS — Z1231 Encounter for screening mammogram for malignant neoplasm of breast: Secondary | ICD-10-CM

## 2015-03-01 ENCOUNTER — Ambulatory Visit (HOSPITAL_COMMUNITY)
Admission: RE | Admit: 2015-03-01 | Discharge: 2015-03-01 | Disposition: A | Discharge status: Home / Self Care | Payer: Managed Care, Other (non HMO) | Source: Ambulatory Visit | Attending: Internal Medicine | Admitting: Internal Medicine

## 2015-03-01 DIAGNOSIS — Z1231 Encounter for screening mammogram for malignant neoplasm of breast: Secondary | ICD-10-CM | POA: Insufficient documentation

## 2015-05-02 ENCOUNTER — Encounter: Payer: Self-pay | Admitting: Internal Medicine

## 2015-09-11 DIAGNOSIS — M8580 Other specified disorders of bone density and structure, unspecified site: Secondary | ICD-10-CM

## 2015-09-11 HISTORY — DX: Other specified disorders of bone density and structure, unspecified site: M85.80

## 2016-05-20 IMAGING — MG MM DIGITAL SCREENING BILAT
4 series · 4 of 4 positions shown · non-contrast
Comparison: Previous exam(s).

CLINICAL DATA: Screening.

EXAM:
DIGITAL SCREENING BILATERAL MAMMOGRAM WITH CAD

[R CC]
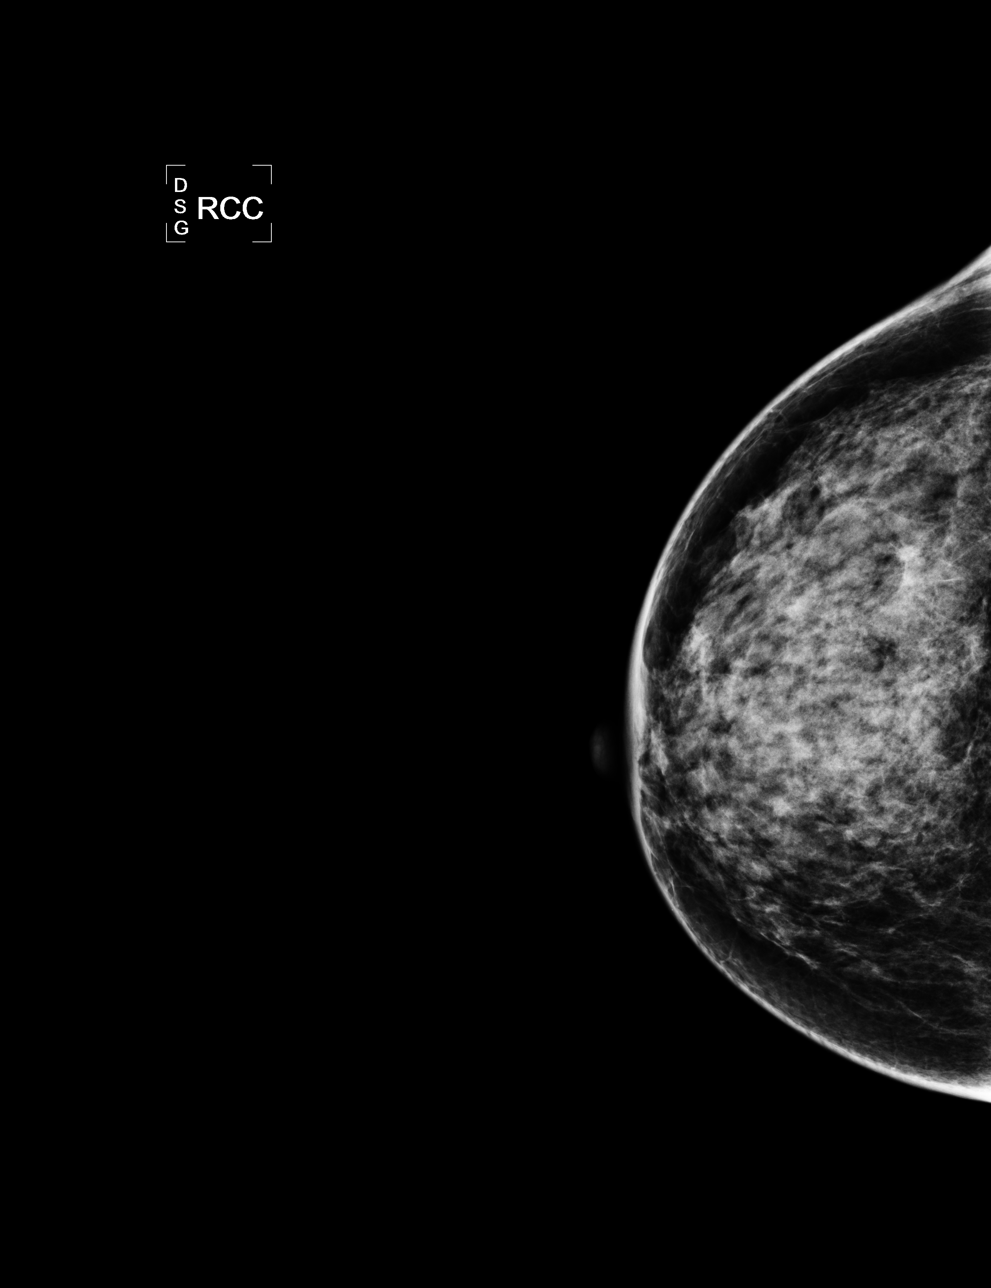

[R MLO]
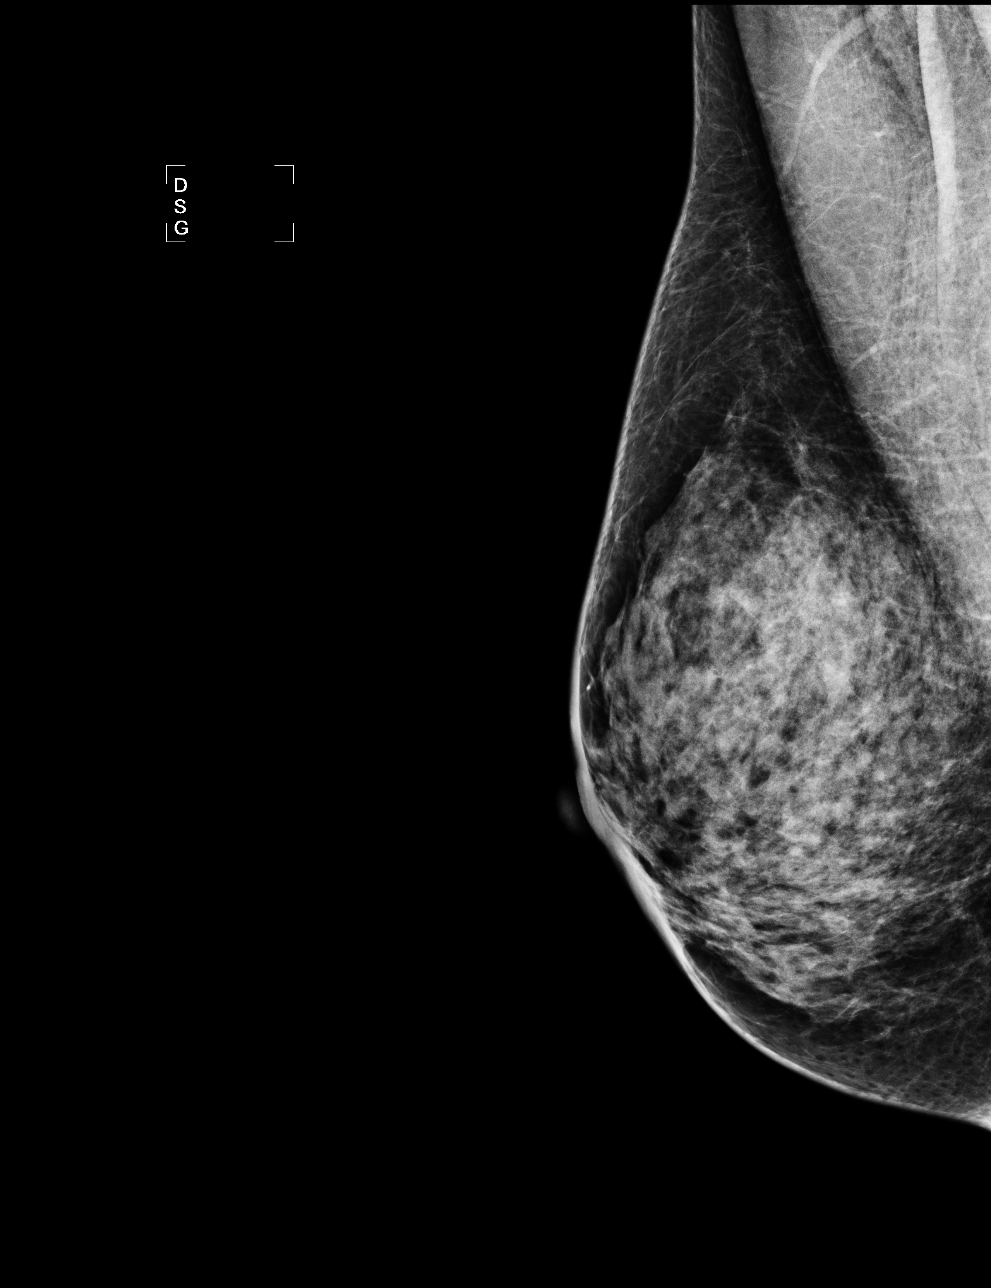

[L CC]
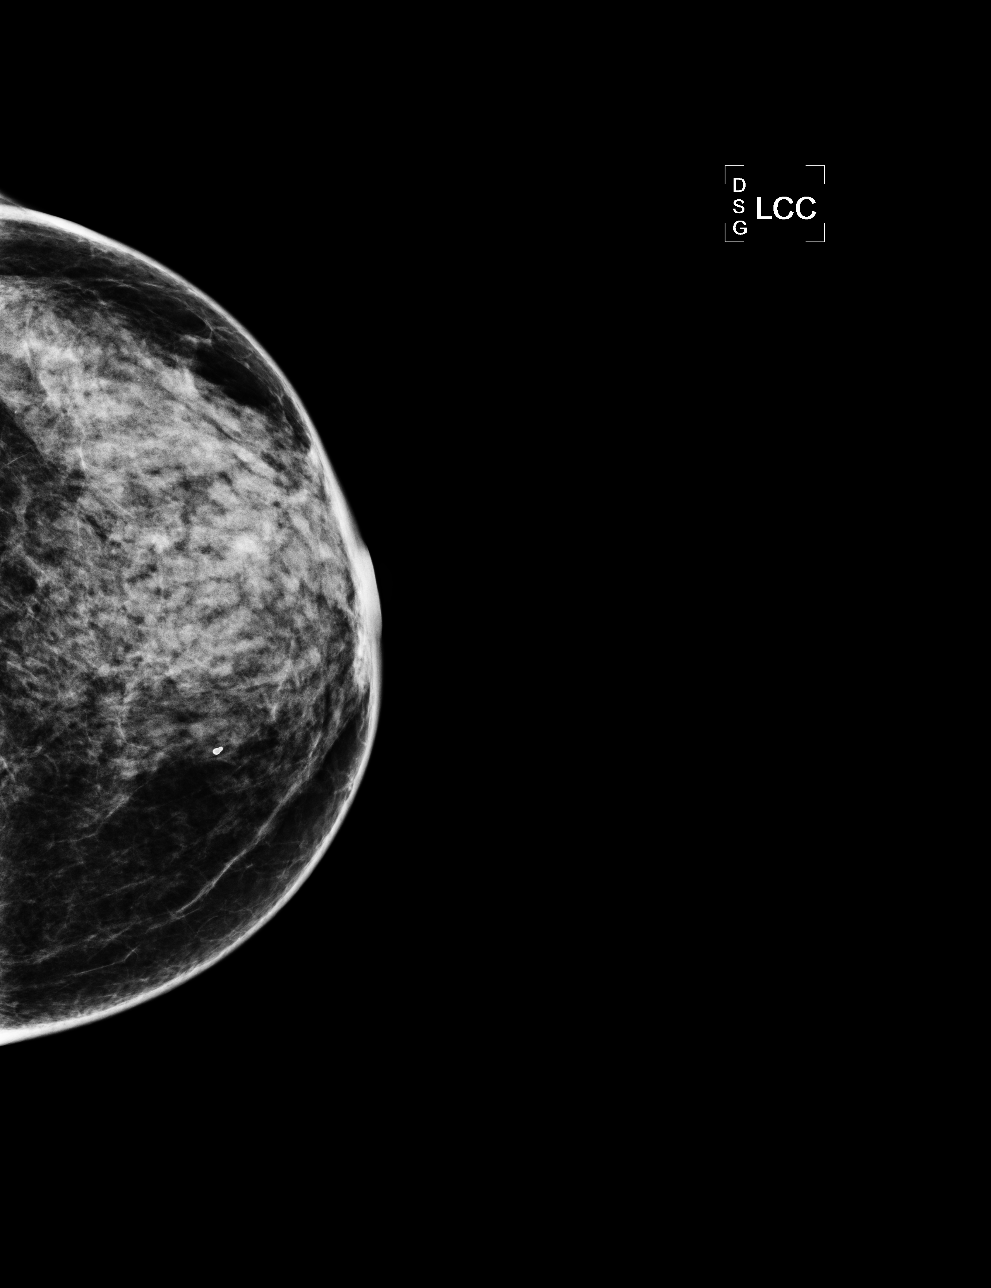

[L MLO]
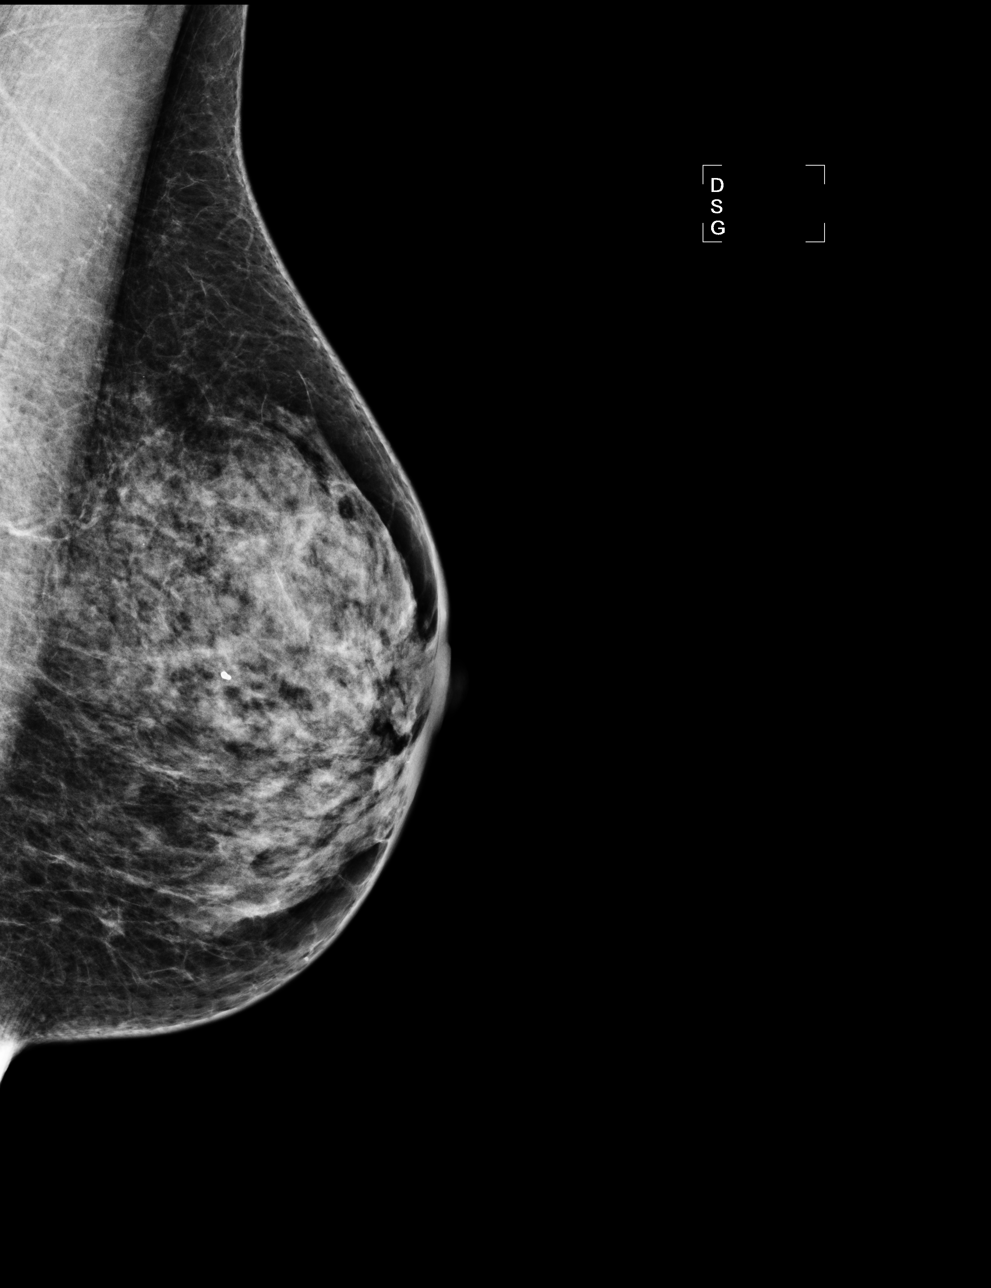

[4 of 4 positions shown; findings below may reference images not displayed]

ACR Breast Density Category d: The breast tissue is extremely dense,
which lowers the sensitivity of mammography.
FINDINGS: There are no findings suspicious for malignancy. Images were
processed with CAD.
IMPRESSION: No mammographic evidence of malignancy. A result letter of this
screening mammogram will be mailed directly to the patient.

RECOMMENDATION:
Screening mammogram in one year. (Code:BD-D-K0F)

BI-RADS CATEGORY  1: Negative.

## 2016-08-16 ENCOUNTER — Other Ambulatory Visit: Payer: Self-pay | Admitting: Internal Medicine

## 2016-08-16 DIAGNOSIS — Z1231 Encounter for screening mammogram for malignant neoplasm of breast: Secondary | ICD-10-CM

## 2016-08-17 ENCOUNTER — Ambulatory Visit (INDEPENDENT_AMBULATORY_CARE_PROVIDER_SITE_OTHER): Payer: Managed Care, Other (non HMO) | Admitting: Internal Medicine

## 2016-08-17 ENCOUNTER — Encounter: Payer: Self-pay | Admitting: Internal Medicine

## 2016-08-17 VITALS — BP 108/72 | HR 78 | Temp 97.8°F | Ht 63.5 in | Wt 121.5 lb

## 2016-08-17 DIAGNOSIS — Z0000 Encounter for general adult medical examination without abnormal findings: Secondary | ICD-10-CM

## 2016-08-17 DIAGNOSIS — H6123 Impacted cerumen, bilateral: Secondary | ICD-10-CM

## 2016-08-17 DIAGNOSIS — Z780 Asymptomatic menopausal state: Secondary | ICD-10-CM | POA: Diagnosis not present

## 2016-08-17 DIAGNOSIS — Z0001 Encounter for general adult medical examination with abnormal findings: Secondary | ICD-10-CM

## 2016-08-17 LAB — LIPID PANEL
CHOLESTEROL: 229 mg/dL — AB (ref 0–200)
HDL: 103.7 mg/dL (ref >39.00)
LDL CALC: 112 mg/dL — AB (ref 0–99)
NONHDL: 125.15
Total CHOL/HDL Ratio: 2
Triglycerides: 67 mg/dL (ref 0.0–149.0)
VLDL: 13.4 mg/dL (ref 0.0–40.0)

## 2016-08-17 LAB — COMPREHENSIVE METABOLIC PANEL
ALK PHOS: 61 U/L (ref 39–117)
ALT: 13 U/L (ref 0–35)
AST: 19 U/L (ref 0–37)
Albumin: 4.9 g/dL (ref 3.5–5.2)
BUN: 11 mg/dL (ref 6–23)
CHLORIDE: 101 meq/L (ref 96–112)
CO2: 33 mEq/L — ABNORMAL HIGH (ref 19–32)
Calcium: 10.2 mg/dL (ref 8.4–10.5)
Creatinine, Ser: 0.76 mg/dL (ref 0.40–1.20)
GFR: 83.15 mL/min (ref >60.00)
GLUCOSE: 92 mg/dL (ref 70–99)
POTASSIUM: 4.7 meq/L (ref 3.5–5.1)
Sodium: 140 mEq/L (ref 135–145)
TOTAL PROTEIN: 7.8 g/dL (ref 6.0–8.3)
Total Bilirubin: 1 mg/dL (ref 0.2–1.2)

## 2016-08-17 LAB — CBC
HCT: 39.3 % (ref 36.0–46.0)
Hemoglobin: 13.2 g/dL (ref 12.0–15.0)
MCHC: 33.5 g/dL (ref 30.0–36.0)
MCV: 89.9 fl (ref 78.0–100.0)
PLATELETS: 273 10*3/uL (ref 150.0–400.0)
RBC: 4.37 Mil/uL (ref 3.87–5.11)
RDW: 13.6 % (ref 11.5–15.5)
WBC: 5.4 10*3/uL (ref 4.0–10.5)

## 2016-08-17 LAB — VITAMIN D 25 HYDROXY (VIT D DEFICIENCY, FRACTURES): VITD: 46.13 ng/mL (ref 30.00–100.00)

## 2016-08-17 LAB — VITAMIN B12

## 2016-08-17 NOTE — Patient Instructions (Signed)

## 2016-08-17 NOTE — Progress Notes (Signed)
Subjective:    Patient ID: Patricia Bryant, female    DOB: 12/26/1960, 55 y.o.   MRN: [id number]  HPI  Pt presents to the clinic today for her annual exam.  Flu: 06/2015 Tetanus: 09/2010 Pap Smear: 01/2015 Mammogram: 02/2015, scheduled 09/2016 Bone Density Exam: never Colon Screening: 09/2010, normal Vision Screening: annually, 11/2015 Dentist: biannually  Diet: She does not eat meat. She eats fruits and veggies daily. She does not eat fried foods. She drinks mostly water. Exercise: She golfs 3 x week. She does strength training and cardio 3-4 days per week.  Review of Systems      Past Medical History:  Diagnosis Date  . Allergy     Current Outpatient Prescriptions  Medication Sig Dispense Refill  . Cholecalciferol (VITAMIN D3) 2000 UNITS TABS Take 1 tablet by mouth daily.    . Cyanocobalamin (VITAMIN B 12 PO) Take 1 tablet by mouth daily.    . Multiple Vitamin (MULTIVITAMIN) tablet Take 1 tablet by mouth daily.     No current facility-administered medications for this visit.     Allergies  Allergen Reactions  . Penicillins     REACTION: hives, ankles swell    Family History  Problem Relation Age of Onset  . Colon cancer Father   . Heart attack Sister   . Heart disease Sister     Social History   Social History  . Marital status: Married    Spouse name: N/A  . Number of children: N/A  . Years of education: 3116   Occupational History  . Manager [employer]   Social History Main Topics  . Smoking status: Former Smoker    Packs/day: 0.50    Years: 15.00    Types: Cigarettes  . Smokeless tobacco: Never Used  . Alcohol use Yes     Comment: occasional  . Drug use: No  . Sexual activity: Yes   Other Topics Concern  . Not on file   Social History Narrative   Regular exercise-yes   Caffeine use-no     Constitutional: Denies fever, malaise, fatigue, headache or abrupt weight changes.  HEENT: Denies eye pain, eye redness, ear pain, ringing in  the ears, wax buildup, runny nose, nasal congestion, bloody nose, or sore throat. Respiratory: Denies difficulty breathing, shortness of breath, cough or sputum production.   Cardiovascular: Denies chest pain, chest tightness, palpitations or swelling in the hands or feet.  Gastrointestinal: Denies abdominal pain, bloating, constipation, diarrhea or blood in the stool.  GU: Denies urgency, frequency, pain with urination, burning sensation, blood in urine, odor or discharge. Musculoskeletal: Denies decrease in range of motion, difficulty with gait, muscle pain or joint pain and swelling.  Skin: Denies redness, rashes, lesions or ulcercations.  Neurological: Denies dizziness, difficulty with memory, difficulty with speech or problems with balance and coordination.  Psych: Denies anxiety, depression, SI/HI.  No other specific complaints in a complete review of systems (except as listed in HPI above).  Objective:   Physical Exam   BP 108/72   Pulse 78   Temp 97.8 F (36.6 C) (Oral)   Ht 5' 3.5" (1.613 m)   Wt 121 lb 8 oz (55.1 kg)   SpO2 98%   BMI 21.19 kg/m  Wt Readings from Last 3 Encounters:  08/17/16 121 lb 8 oz (55.1 kg)  01/14/15 127 lb 8 oz (57.8 kg)  01/08/14 126 lb (57.2 kg)    General: Appears her stated age, well developed, well nourished in NAD. Skin: Warm,  dry and intact.  HEENT: Head: normal shape and size; Eyes: sclera white, no icterus, conjunctiva pink, PERRLA and EOMs intact; Ears: bilateral cerumen impaction; Throat/Mouth: Teeth present, mucosa pink and moist, no exudate, lesions or ulcerations noted.  Neck:  Neck supple, trachea midline. No masses, lumps or thyromegaly present.  Cardiovascular: Normal rate and rhythm. S1,S2 noted.  No murmur, rubs or gallops noted. No JVD or BLE edema. No carotid bruits noted. Pulmonary/Chest: Normal effort and positive vesicular breath sounds. No respiratory distress. No wheezes, rales or ronchi noted.  Abdomen: Soft and  nontender. Normal bowel sounds. No distention or masses noted. Liver, spleen and kidneys non palpable. Musculoskeletal: Normal range of motion. No signs of joint swelling. Strength 5/5 BUE/BLE. No difficulty with gait.  Neurological: Alert and oriented. Cranial nerves II-XII grossly intact. Coordination normal.  Psychiatric: Mood and affect normal. Behavior is normal. Judgment and thought content normal.     BMET    Component Value Date/Time   NA 138 01/14/2015 1518   K 4.8 01/14/2015 1518   CL 101 01/14/2015 1518   CO2 35 (H) 01/14/2015 1518   GLUCOSE 86 01/14/2015 1518   BUN 6 01/14/2015 1518   CREATININE 0.74 01/14/2015 1518   CALCIUM 9.7 01/14/2015 1518    Lipid Panel     Component Value Date/Time   CHOL 220 (H) 01/14/2015 1518   TRIG 76.0 01/14/2015 1518   HDL 121.00 01/14/2015 1518   CHOLHDL 2 01/14/2015 1518   VLDL 15.2 01/14/2015 1518   LDLCALC 84 01/14/2015 1518    CBC    Component Value Date/Time   WBC 3.7 (L) 01/14/2015 1518   RBC 4.47 01/14/2015 1518   HGB 13.1 01/14/2015 1518   HCT 38.4 01/14/2015 1518   PLT 245.0 01/14/2015 1518   MCV 85.9 01/14/2015 1518   MCHC 34.1 01/14/2015 1518   RDW 14.1 01/14/2015 1518    Hgb A1C Lab Results  Component Value Date   HGBA1C 5.4 01/08/2014           Assessment & Plan:   Preventative Health Maintenance:  She declines flu shot today Tetanus UTD Mammogram scheduled Bone Density ordered- she will call to have this added on with her mammogram Pap smear and colonoscopy UTD Encouraged her to continue balanced diet and exercise regimen Advised her to continue to see an eye doctor and dentist annually Will check CBC, CMET and Lipid She declines HIV and Hep C today  Bilateral cerumen impaction:  Manual lavage by CMA Advised her to try Debrox 2 x week to prevent wax buildup   RTC in 1 year, sooner if needed Nicki ReaperBAITY, Braedon Sjogren, NP

## 2016-09-12 ENCOUNTER — Ambulatory Visit
Admission: RE | Admit: 2016-09-12 | Discharge: 2016-09-12 | Disposition: A | Discharge status: Home / Self Care | Payer: Managed Care, Other (non HMO) | Source: Ambulatory Visit | Attending: Internal Medicine | Admitting: Internal Medicine

## 2016-09-12 ENCOUNTER — Ambulatory Visit: Payer: Managed Care, Other (non HMO)

## 2016-09-12 DIAGNOSIS — Z1231 Encounter for screening mammogram for malignant neoplasm of breast: Secondary | ICD-10-CM

## 2016-09-12 DIAGNOSIS — Z780 Asymptomatic menopausal state: Secondary | ICD-10-CM

## 2016-12-17 ENCOUNTER — Ambulatory Visit: Payer: Self-pay | Admitting: Podiatry

## 2017-01-03 ENCOUNTER — Ambulatory Visit: Payer: Self-pay | Admitting: Podiatry

## 2018-10-01 ENCOUNTER — Other Ambulatory Visit: Payer: Self-pay | Admitting: Internal Medicine

## 2018-10-01 DIAGNOSIS — Z1231 Encounter for screening mammogram for malignant neoplasm of breast: Secondary | ICD-10-CM

## 2018-10-03 ENCOUNTER — Ambulatory Visit
Admission: RE | Admit: 2018-10-03 | Discharge: 2018-10-03 | Disposition: A | Discharge status: Home / Self Care | Payer: Managed Care, Other (non HMO) | Source: Ambulatory Visit

## 2018-10-03 DIAGNOSIS — Z1231 Encounter for screening mammogram for malignant neoplasm of breast: Secondary | ICD-10-CM

## 2018-11-13 ENCOUNTER — Ambulatory Visit (INDEPENDENT_AMBULATORY_CARE_PROVIDER_SITE_OTHER): Payer: PRIVATE HEALTH INSURANCE | Admitting: Internal Medicine

## 2018-11-13 ENCOUNTER — Encounter: Payer: Self-pay | Admitting: Internal Medicine

## 2018-11-13 VITALS — BP 106/68 | HR 70 | Temp 97.9°F | Ht 63.75 in | Wt 125.0 lb

## 2018-11-13 DIAGNOSIS — N951 Menopausal and female climacteric states: Secondary | ICD-10-CM

## 2018-11-13 DIAGNOSIS — Z23 Encounter for immunization: Secondary | ICD-10-CM

## 2018-11-13 DIAGNOSIS — Z0000 Encounter for general adult medical examination without abnormal findings: Secondary | ICD-10-CM

## 2018-11-13 LAB — COMPREHENSIVE METABOLIC PANEL
ALK PHOS: 74 U/L (ref 39–117)
ALT: 10 U/L (ref 0–35)
AST: 17 U/L (ref 0–37)
Albumin: 4.5 g/dL (ref 3.5–5.2)
BILIRUBIN TOTAL: 1.1 mg/dL (ref 0.2–1.2)
BUN: 12 mg/dL (ref 6–23)
CO2: 31 mEq/L (ref 19–32)
Calcium: 9.7 mg/dL (ref 8.4–10.5)
Chloride: 101 mEq/L (ref 96–112)
Creatinine, Ser: 0.75 mg/dL (ref 0.40–1.20)
GFR: 78.83 mL/min (ref >60.00)
Glucose, Bld: 88 mg/dL (ref 70–99)
POTASSIUM: 4.4 meq/L (ref 3.5–5.1)
SODIUM: 139 meq/L (ref 135–145)
Total Protein: 7.2 g/dL (ref 6.0–8.3)

## 2018-11-13 LAB — CBC
HEMATOCRIT: 39.9 % (ref 36.0–46.0)
Hemoglobin: 13.3 g/dL (ref 12.0–15.0)
MCHC: 33.4 g/dL (ref 30.0–36.0)
MCV: 92.2 fl (ref 78.0–100.0)
Platelets: 252 10*3/uL (ref 150.0–400.0)
RBC: 4.33 Mil/uL (ref 3.87–5.11)
RDW: 13.6 % (ref 11.5–15.5)
WBC: 4.9 10*3/uL (ref 4.0–10.5)

## 2018-11-13 LAB — LIPID PANEL
CHOLESTEROL: 217 mg/dL — AB (ref 0–200)
HDL: 102.8 mg/dL (ref >39.00)
LDL Cholesterol: 101 mg/dL — ABNORMAL HIGH (ref 0–99)
NonHDL: 114.61
Total CHOL/HDL Ratio: 2
Triglycerides: 68 mg/dL (ref 0.0–149.0)
VLDL: 13.6 mg/dL (ref 0.0–40.0)

## 2018-11-13 LAB — VITAMIN D 25 HYDROXY (VIT D DEFICIENCY, FRACTURES): VITD: 48.49 ng/mL (ref 30.00–100.00)

## 2018-11-13 LAB — VITAMIN B12: Vitamin B-12: 241 pg/mL (ref 211–911)

## 2018-11-13 NOTE — Patient Instructions (Signed)
Health Maintenance for Postmenopausal Women Menopause is a normal process in which your reproductive ability comes to an end. This process happens gradually over a span of months to years, usually between the ages of 62 and 89. Menopause is complete when you have missed 12 consecutive menstrual periods. It is important to talk with your health care provider about some of the most common conditions that affect postmenopausal women, such as heart disease, cancer, and bone loss (osteoporosis). Adopting a healthy lifestyle and getting preventive care can help to promote your health and wellness. Those actions can also lower your chances of developing some of these common conditions. What should I know about menopause? During menopause, you may experience a number of symptoms, such as:  Moderate-to-severe hot flashes.  Night sweats.  Decrease in sex drive.  Mood swings.  Headaches.  Tiredness.  Irritability.  Memory problems.  Insomnia. Choosing to treat or not to treat menopausal changes is an individual decision that you make with your health care provider. What should I know about hormone replacement therapy and supplements? Hormone therapy products are effective for treating symptoms that are associated with menopause, such as hot flashes and night sweats. Hormone replacement carries certain risks, especially as you become older. If you are thinking about using estrogen or estrogen with progestin treatments, discuss the benefits and risks with your health care provider. What should I know about heart disease and stroke? Heart disease, heart attack, and stroke become more likely as you age. This may be due, in part, to the hormonal changes that your body experiences during menopause. These can affect how your body processes dietary fats, triglycerides, and cholesterol. Heart attack and stroke are both medical emergencies. There are many things that you can do to help prevent heart disease  and stroke:  Have your blood pressure checked at least every 1-2 years. High blood pressure causes heart disease and increases the risk of stroke.  If you are 79-72 years old, ask your health care provider if you should take aspirin to prevent a heart attack or a stroke.  Do not use any tobacco products, including cigarettes, chewing tobacco, or electronic cigarettes. If you need help quitting, ask your health care provider.  It is important to eat a healthy diet and maintain a healthy weight. ? Be sure to include plenty of vegetables, fruits, low-fat dairy products, and lean protein. ? Avoid eating foods that are high in solid fats, added sugars, or salt (sodium).  Get regular exercise. This is one of the most important things that you can do for your health. ? Try to exercise for at least 150 minutes each week. The type of exercise that you do should increase your heart rate and make you sweat. This is known as moderate-intensity exercise. ? Try to do strengthening exercises at least twice each week. Do these in addition to the moderate-intensity exercise.  Know your numbers.Ask your health care provider to check your cholesterol and your blood glucose. Continue to have your blood tested as directed by your health care provider.  What should I know about cancer screening? There are several types of cancer. Take the following steps to reduce your risk and to catch any cancer development as early as possible. Breast Cancer  Practice breast self-awareness. ? This means understanding how your breasts normally appear and feel. ? It also means doing regular breast self-exams. Let your health care provider know about any changes, no matter how small.  If you are 40 or  older, have a clinician do a breast exam (clinical breast exam or CBE) every year. Depending on your age, family history, and medical history, it may be recommended that you also have a yearly breast X-ray (mammogram).  If you  have a family history of breast cancer, talk with your health care provider about genetic screening.  If you are at high risk for breast cancer, talk with your health care provider about having an MRI and a mammogram every year.  Breast cancer (BRCA) gene test is recommended for women who have family members with BRCA-related cancers. Results of the assessment will determine the need for genetic counseling and BRCA1 and for BRCA2 testing. BRCA-related cancers include these types: ? Breast. This occurs in males or females. ? Ovarian. ? Tubal. This may also be called fallopian tube cancer. ? Cancer of the abdominal or pelvic lining (peritoneal cancer). ? Prostate. ? Pancreatic. Cervical, Uterine, and Ovarian Cancer Your health care provider may recommend that you be screened regularly for cancer of the pelvic organs. These include your ovaries, uterus, and vagina. This screening involves a pelvic exam, which includes checking for microscopic changes to the surface of your cervix (Pap test).  For women ages 21-65, health care providers may recommend a pelvic exam and a Pap test every three years. For women ages 39-65, they may recommend the Pap test and pelvic exam, combined with testing for human papilloma virus (HPV), every five years. Some types of HPV increase your risk of cervical cancer. Testing for HPV may also be done on women of any age who have unclear Pap test results.  Other health care providers may not recommend any screening for nonpregnant women who are considered low risk for pelvic cancer and have no symptoms. Ask your health care provider if a screening pelvic exam is right for you.  If you have had past treatment for cervical cancer or a condition that could lead to cancer, you need Pap tests and screening for cancer for at least 20 years after your treatment. If Pap tests have been discontinued for you, your risk factors (such as having a new sexual partner) need to be reassessed  to determine if you should start having screenings again. Some women have medical problems that increase the chance of getting cervical cancer. In these cases, your health care provider may recommend that you have screening and Pap tests more often.  If you have a family history of uterine cancer or ovarian cancer, talk with your health care provider about genetic screening.  If you have vaginal bleeding after reaching menopause, tell your health care provider.  There are currently no reliable tests available to screen for ovarian cancer. Lung Cancer Lung cancer screening is recommended for adults 57-50 years old who are at high risk for lung cancer because of a history of smoking. A yearly low-dose CT scan of the lungs is recommended if you:  Currently smoke.  Have a history of at least 30 pack-years of smoking and you currently smoke or have quit within the past 15 years. A pack-year is smoking an average of one pack of cigarettes per day for one year. Yearly screening should:  Continue until it has been 15 years since you quit.  Stop if you develop a health problem that would prevent you from having lung cancer treatment. Colorectal Cancer  This type of cancer can be detected and can often be prevented.  Routine colorectal cancer screening usually begins at age 12 and continues through  age 75.  If you have risk factors for colon cancer, your health care provider may recommend that you be screened at an earlier age.  If you have a family history of colorectal cancer, talk with your health care provider about genetic screening.  Your health care provider may also recommend using home test kits to check for hidden blood in your stool.  A small camera at the end of a tube can be used to examine your colon directly (sigmoidoscopy or colonoscopy). This is done to check for the earliest forms of colorectal cancer.  Direct examination of the colon should be repeated every 5-10 years until  age 75. However, if early forms of precancerous polyps or small growths are found or if you have a family history or genetic risk for colorectal cancer, you may need to be screened more often. Skin Cancer  Check your skin from head to toe regularly.  Monitor any moles. Be sure to tell your health care provider: ? About any new moles or changes in moles, especially if there is a change in a mole's shape or color. ? If you have a mole that is larger than the size of a pencil eraser.  If any of your family members has a history of skin cancer, especially at a young age, talk with your health care provider about genetic screening.  Always use sunscreen. Apply sunscreen liberally and repeatedly throughout the day.  Whenever you are outside, protect yourself by wearing long sleeves, pants, a wide-brimmed hat, and sunglasses. What should I know about osteoporosis? Osteoporosis is a condition in which bone destruction happens more quickly than new bone creation. After menopause, you may be at an increased risk for osteoporosis. To help prevent osteoporosis or the bone fractures that can happen because of osteoporosis, the following is recommended:  If you are 19-50 years old, get at least 1,000 mg of calcium and at least 600 mg of vitamin D per day.  If you are older than age 50 but younger than age 70, get at least 1,200 mg of calcium and at least 600 mg of vitamin D per day.  If you are older than age 70, get at least 1,200 mg of calcium and at least 800 mg of vitamin D per day. Smoking and excessive alcohol intake increase the risk of osteoporosis. Eat foods that are rich in calcium and vitamin D, and do weight-bearing exercises several times each week as directed by your health care provider. What should I know about how menopause affects my mental health? Depression may occur at any age, but it is more common as you become older. Common symptoms of depression include:  Low or sad  mood.  Changes in sleep patterns.  Changes in appetite or eating patterns.  Feeling an overall lack of motivation or enjoyment of activities that you previously enjoyed.  Frequent crying spells. Talk with your health care provider if you think that you are experiencing depression. What should I know about immunizations? It is important that you get and maintain your immunizations. These include:  Tetanus, diphtheria, and pertussis (Tdap) booster vaccine.  Influenza every year before the flu season begins.  Pneumonia vaccine.  Shingles vaccine. Your health care provider may also recommend other immunizations. This information is not intended to replace advice given to you by your health care provider. Make sure you discuss any questions you have with your health care provider. Document Released: 10/19/2005 Document Revised: 03/16/2016 Document Reviewed: 05/31/2015 Elsevier Interactive Patient Education    2019 [place].

## 2018-11-13 NOTE — Progress Notes (Signed)
Subjective:    Patient ID: Patricia Bryant, female    DOB: 1961-04-05, 58 y.o.   MRN: [id number]  HPI  Pt presents to the clinic today for her annual exam.  Flu: 2015 Tetanus: 1/ 2012 Pap Smear: 01/2015 Mammogram: 09/2018 Colon Screening: 09/2010 (father subsequently developed colon cancer after this) Vision Screening: annually Dentist: biannually  Diet: She does eat meat. She consumes fruits and veggies daily. She does not eat fried foods. She drinks mostly water. Exercise: body weight exercises 6 days per week  Review of Systems  Past Medical History:  Diagnosis Date  . Allergy     Current Outpatient Medications  Medication Sig Dispense Refill  . Cholecalciferol (VITAMIN D3) 2000 UNITS TABS Take 1 tablet by mouth daily.    . Cyanocobalamin (VITAMIN B 12 PO) Take 1 tablet by mouth daily.    . Multiple Vitamin (MULTIVITAMIN) tablet Take 1 tablet by mouth daily.    . Omega-3 Fatty Acids (FISH OIL) 1000 MG CAPS Take by mouth.     No current facility-administered medications for this visit.     Allergies  Allergen Reactions  . Penicillins     REACTION: hives, ankles swell    Family History  Problem Relation Age of Onset  . Colon cancer Father   . Heart attack Sister   . Heart disease Sister   . Pancreatic cancer Sister     Social History   Socioeconomic History  . Marital status: Married    Spouse name: Not on file  . Number of children: Not on file  . Years of education: 87  . Highest education level: Not on file  Occupational History  . Occupation: Event organiser: Banker  Social Needs  . Financial resource strain: Not on file  . Food insecurity:    Worry: Not on file    Inability: Not on file  . Transportation needs:    Medical: Not on file    Non-medical: Not on file  Tobacco Use  . Smoking status: Former Smoker    Packs/day: 0.50    Years: 15.00    Pack years: 7.50    Types: Cigarettes  . Smokeless tobacco: Never Used    Substance and Sexual Activity  . Alcohol use: Yes    Comment: occasional  . Drug use: No  . Sexual activity: Yes  Lifestyle  . Physical activity:    Days per week: Not on file    Minutes per session: Not on file  . Stress: Not on file  Relationships  . Social connections:    Talks on phone: Not on file    Gets together: Not on file    Attends religious service: Not on file    Active member of club or organization: Not on file    Attends meetings of clubs or organizations: Not on file    Relationship status: Not on file  . Intimate partner violence:    Fear of current or ex partner: Not on file    Emotionally abused: Not on file    Physically abused: Not on file    Forced sexual activity: Not on file  Other Topics Concern  . Not on file  Social History Narrative   Regular exercise-yes   Caffeine use-no     Constitutional: Denies fever, malaise, fatigue, headache or abrupt weight changes.  HEENT: Denies eye pain, eye redness, ear pain, ringing in the ears, wax buildup, runny nose, nasal congestion, bloody nose, or sore  throat. Respiratory: Denies difficulty breathing, shortness of breath, cough or sputum production.   Cardiovascular: Denies chest pain, chest tightness, palpitations or swelling in the hands or feet.  Gastrointestinal: Denies abdominal pain, bloating, constipation, diarrhea or blood in the stool.  GU: Pt reports vaginal dryness. Denies urgency, frequency, pain with urination, burning sensation, blood in urine, odor or discharge. Musculoskeletal: Denies decrease in range of motion, difficulty with gait, muscle pain or joint pain and swelling.  Skin: Denies redness, rashes, lesions or ulcercations.  Neurological: Denies dizziness, difficulty with memory, difficulty with speech or problems with balance and coordination.  Psych: Denies anxiety, depression, SI/HI.  No other specific complaints in a complete review of systems (except as listed in HPI above).      Objective:   Physical Exam BP 106/68   Pulse 70   Temp 97.9 F (36.6 C) (Oral)   Ht 5' 3.75" (1.619 m)   Wt 125 lb (56.7 kg)   SpO2 98%   BMI 21.62 kg/m  Wt Readings from Last 3 Encounters:  11/13/18 125 lb (56.7 kg)  08/17/16 121 lb 8 oz (55.1 kg)  01/14/15 127 lb 8 oz (57.8 kg)    General: Appears her stated age, well developed, well nourished in NAD. Skin: Warm, dry and intact.  HEENT: Head: normal shape and size; Eyes: sclera white, no icterus, conjunctiva pink, PERRLA and EOMs intact; Ears: Tm's gray and intact, normal light reflex;Throat/Mouth: Teeth present, mucosa pink and moist, no exudate, lesions or ulcerations noted.  Neck:  Neck supple, trachea midline. No masses, lumps or thyromegaly present.  Cardiovascular: Normal rate and rhythm. S1,S2 noted.  No murmur, rubs or gallops noted. No JVD or BLE edema. No carotid bruits noted. Pulmonary/Chest: Normal effort and positive vesicular breath sounds. No respiratory distress. No wheezes, rales or ronchi noted.  Abdomen: Soft and nontender. Normal bowel sounds. No distention or masses noted. Liver, spleen and kidneys non palpable. Musculoskeletal: Strength 5/5 BUE/BLE. No difficulty with gait.  Neurological: Alert and oriented. Cranial nerves II-XII grossly intact. Coordination normal.  Psychiatric: Mood and affect normal. Behavior is normal. Judgment and thought content normal.     BMET    Component Value Date/Time   NA 140 08/17/2016 1529   K 4.7 08/17/2016 1529   CL 101 08/17/2016 1529   CO2 33 (H) 08/17/2016 1529   GLUCOSE 92 08/17/2016 1529   BUN 11 08/17/2016 1529   CREATININE 0.76 08/17/2016 1529   CALCIUM 10.2 08/17/2016 1529    Lipid Panel     Component Value Date/Time   CHOL 229 (H) 08/17/2016 1529   TRIG 67.0 08/17/2016 1529   HDL 103.70 08/17/2016 1529   CHOLHDL 2 08/17/2016 1529   VLDL 13.4 08/17/2016 1529   LDLCALC 112 (H) 08/17/2016 1529    CBC    Component Value Date/Time   WBC 5.4  08/17/2016 1529   RBC 4.37 08/17/2016 1529   HGB 13.2 08/17/2016 1529   HCT 39.3 08/17/2016 1529   PLT 273.0 08/17/2016 1529   MCV 89.9 08/17/2016 1529   MCHC 33.5 08/17/2016 1529   RDW 13.6 08/17/2016 1529    Hgb A1C Lab Results  Component Value Date   HGBA1C 5.4 01/08/2014             Assessment & Plan:   Preventative Health Maintenance:  Flu shot today Tetanus UTD Pap smear due 2021 Mammogram UTD Referral to GI for screening colonoscopy Encouraged her to consume a balanced diet and exercise regimen Advised her  to see an eye doctor and dentist annually Will check CBC, CMET, Lipid, Vit D and B12 today She declines HIV or Hep C screening today  Vaginal Dryness:  Discussed how this relates to postmenopause Encouraged her to use water based lubricants Discussed Estrace cream. She will consider this and let me know  RTC in 1 year, sooner if needed Nicki Reaper, NP

## 2020-05-24 ENCOUNTER — Other Ambulatory Visit: Payer: Self-pay

## 2020-05-24 ENCOUNTER — Ambulatory Visit (AMBULATORY_SURGERY_CENTER): Payer: Self-pay

## 2020-05-24 ENCOUNTER — Encounter: Payer: Self-pay | Admitting: Gastroenterology

## 2020-05-24 VITALS — Ht 63.75 in | Wt 120.8 lb

## 2020-05-24 DIAGNOSIS — Z800 Family history of malignant neoplasm of digestive organs: Secondary | ICD-10-CM

## 2020-05-24 DIAGNOSIS — Z1211 Encounter for screening for malignant neoplasm of colon: Secondary | ICD-10-CM

## 2020-05-24 MED ORDER — SUTAB 1479-225-188 MG PO TABS: 1.0000 | ORAL_TABLET | ORAL | 0 refills | Status: DC

## 2020-05-24 NOTE — Progress Notes (Signed)
No egg or soy allergy known to patient  No issues with past sedation with any surgeries or procedures No intubation problems in the past  No FH of Malignant Hyperthermia No diet pills per patient No home 02 use per patient  No blood thinners per patient  Pt denies issues with constipation  No A fib or A flutter  EMMI video via MyChart  COVID 19 guidelines implemented in [facility] today with Pt and RN  Coupon given to pt in [facility] today , Code to Pharmacy  COVID vaccines completed on 12/2019 per pt;  Due to the COVID-19 pandemic we are asking patients to follow these guidelines. Please only bring one care partner. Please be aware that your care partner may wait in the car in the parking lot or if they feel like they will be too hot to wait in the car, they may wait in the lobby on the 4th floor. All care partners are required to wear a mask the entire time (we do not have any that we can provide them), they need to practice social distancing, and we will do a Covid check for all patient's and care partners when you arrive. Also we will check their temperature and your temperature. If the care partner waits in their car they need to stay in the parking lot the entire time and we will call them on their cell phone when the patient is ready for discharge so they can bring the car to the front of the building. Also all patient's will need to wear a mask into building.  

## 2020-06-07 ENCOUNTER — Ambulatory Visit (AMBULATORY_SURGERY_CENTER): Payer: Managed Care, Other (non HMO) | Admitting: Gastroenterology

## 2020-06-07 ENCOUNTER — Other Ambulatory Visit: Payer: Self-pay

## 2020-06-07 ENCOUNTER — Encounter: Payer: Self-pay | Admitting: Gastroenterology

## 2020-06-07 VITALS — BP 130/83 | HR 70 | Temp 97.1°F | Resp 15 | Ht 64.5 in | Wt 120.8 lb

## 2020-06-07 DIAGNOSIS — Z1211 Encounter for screening for malignant neoplasm of colon: Secondary | ICD-10-CM

## 2020-06-07 DIAGNOSIS — Z800 Family history of malignant neoplasm of digestive organs: Secondary | ICD-10-CM

## 2020-06-07 MED ORDER — SODIUM CHLORIDE 0.9 % IV SOLN
500.0000 mL | Freq: Once | INTRAVENOUS | Status: DC
Start: 2020-06-07 — End: 2020-06-07

## 2020-06-07 NOTE — Patient Instructions (Signed)
Thank you for letting us take care of your healthcare needs today. See handout given to you on Hemorrhoids.   YOU HAD AN ENDOSCOPIC PROCEDURE TODAY AT THE [facility]:   Refer to the procedure report that was given to you for any specific questions about what was found during the examination.  If the procedure report does not answer your questions, please call your gastroenterologist to clarify.  If you requested that your care partner not be given the details of your procedure findings, then the procedure report has been included in a sealed envelope for you to review at your convenience later.  YOU SHOULD EXPECT: Some feelings of bloating in the abdomen. Passage of more gas than usual.  Walking can help get rid of the air that was put into your GI tract during the procedure and reduce the bloating. If you had a lower endoscopy (such as a colonoscopy or flexible sigmoidoscopy) you may notice spotting of blood in your stool or on the toilet paper. If you underwent a bowel prep for your procedure, you may not have a normal bowel movement for a few days.  Please Note:  You might notice some irritation and congestion in your nose or some drainage.  This is from the oxygen used during your procedure.  There is no need for concern and it should clear up in a day or so.  SYMPTOMS TO REPORT IMMEDIATELY:   Following lower endoscopy (colonoscopy or flexible sigmoidoscopy):  Excessive amounts of blood in the stool  Significant tenderness or worsening of abdominal pains  Swelling of the abdomen that is new, acute  Fever of 100F or higher    For urgent or emergent issues, a gastroenterologist can be reached at any hour by calling [phone]. Do not use MyChart messaging for urgent concerns.    DIET:  We do recommend a small meal at first, but then you may proceed to your regular diet.  Drink plenty of fluids but you should avoid alcoholic beverages for 24 hours.  ACTIVITY:  You  should plan to take it easy for the rest of today and you should NOT DRIVE or use heavy machinery until tomorrow (because of the sedation medicines used during the test).    FOLLOW UP: Our staff will call the number listed on your records 48-72 hours following your procedure to check on you and address any questions or concerns that you may have regarding the information given to you following your procedure. If we do not reach you, we will leave a message.  We will attempt to reach you two times.  During this call, we will ask if you have developed any symptoms of COVID 19. If you develop any symptoms (ie: fever, flu-like symptoms, shortness of breath, cough etc.) before then, please call [phone].  If you test positive for Covid 19 in the 2 weeks post procedure, please call and report this information to [place].    If any biopsies were taken you will be contacted by phone or by letter within the next 1-3 weeks.  Please call us at [phone] if you have not heard about the biopsies in 3 weeks.    SIGNATURES/CONFIDENTIALITY: You and/or your care partner have signed paperwork which will be entered into your electronic medical record.  These signatures attest to the fact that that the information above on your After Visit Summary has been reviewed and is understood.  Full responsibility of the confidentiality of this discharge information lies with  you and/or your care-partner.

## 2020-06-07 NOTE — Op Note (Addendum)
[facility] Patient Name: Patricia Bryant Procedure Date: 06/07/2020 7:13 AM MRN: [id number] Endoscopist: Napoleon Form , MD Age: 59 Referring MD:  Date of Birth: 15-Nov-1960 Gender: Female Account #: [id number] Procedure:                Colonoscopy Indications:              Screening in patient at increased risk: Colorectal                            cancer in father 5 or older Medicines:                Monitored Anesthesia Care Procedure:                Pre-Anesthesia Assessment:                           - Prior to the procedure, a History and Physical                            was performed, and patient medications and                            allergies were reviewed. The patient's tolerance of                            previous anesthesia was also reviewed. The risks                            and benefits of the procedure and the sedation                            options and risks were discussed with the patient.                            All questions were answered, and informed consent                            was obtained. Prior Anticoagulants: The patient has                            taken no previous anticoagulant or antiplatelet                            agents. ASA Grade Assessment: I - A normal, healthy                            patient. After reviewing the risks and benefits,                            the patient was deemed in satisfactory condition to                            undergo the procedure.  After obtaining informed consent, the colonoscope                            was passed under direct vision. Throughout the                            procedure, the patient's blood pressure, pulse, and                            oxygen saturations were monitored continuously. The                            Colonoscope was introduced through the anus and                            advanced to the the cecum, identified  by                            appendiceal orifice and ileocecal valve. The                            colonoscopy was performed without difficulty. The                            patient tolerated the procedure well. The quality                            of the bowel preparation was excellent. The                            ileocecal valve, appendiceal orifice, and rectum                            were photographed. Scope In: 8:09:09 AM Scope Out: 8:24:18 AM Scope Withdrawal Time: 0 hours 10 minutes 2 seconds  Total Procedure Duration: 0 hours 15 minutes 9 seconds  Findings:                 The perianal and digital rectal examinations were                            normal.                           Non-bleeding internal hemorrhoids were found during                            retroflexion. The hemorrhoids were small.                           The exam was otherwise without abnormality. Complications:            No immediate complications. Estimated Blood Loss:     Estimated blood loss was minimal. Impression:               - Non-bleeding internal hemorrhoids.                           -  The examination was otherwise normal.                           - No specimens collected. Recommendation:           - Patient has a contact number available for                            emergencies. The signs and symptoms of potential                            delayed complications were discussed with the                            patient. Return to normal activities tomorrow.                            Written discharge instructions were provided to the                            patient.                           - Resume previous diet.                           - Continue present medications.                           - Repeat colonoscopy in 5 years for screening                            purposes. Napoleon Form, MD 06/07/2020 8:28:28 AM This report has been signed electronically.

## 2020-06-07 NOTE — Progress Notes (Signed)
Pt's states no medical or surgical changes since previsit or office visit.  SF - vitals 

## 2020-06-07 NOTE — Progress Notes (Signed)
pt tolerated well. VSS. awake and to recovery. Report given to RN. Lidocaine 2% 8mL IV given with initial propofol as per Dr. Lavon Paganini.

## 2020-06-09 ENCOUNTER — Other Ambulatory Visit: Payer: Self-pay

## 2020-06-09 ENCOUNTER — Telehealth: Payer: Self-pay | Admitting: *Deleted

## 2020-06-09 DIAGNOSIS — R197 Diarrhea, unspecified: Secondary | ICD-10-CM

## 2020-06-09 NOTE — Telephone Encounter (Signed)
  Follow up Call-  Call back number 06/07/2020  Post procedure Call Back phone  # [phone]  Permission to leave phone message Yes  Some recent data might be hidden     Patient questions:  Do you have a fever, pain , or abdominal swelling? No. Pain Score  0 *  Have you tolerated food without any problems? Yes.    Have you been able to return to your normal activities? Yes.    Do you have any questions about your discharge instructions: Diet   No. Medications  No. Follow up visit  No.  Do you have questions or concerns about your Care? No.  Actions: * If pain score is 4 or above: No action needed, pain <4.  1. Have you developed a fever since your procedure? no  2.   Have you had an respiratory symptoms (SOB or cough) since your procedure? no  3.   Have you tested positive for COVID 19 since your procedure no  4.   Have you had any family members/close contacts diagnosed with the COVID 19 since your procedure?  no   If yes to any of these questions please route to Laverna Peace, RN and Karlton Lemon, RN

## 2020-06-13 ENCOUNTER — Other Ambulatory Visit: Payer: Self-pay

## 2020-06-13 ENCOUNTER — Other Ambulatory Visit: Payer: Self-pay | Admitting: Internal Medicine

## 2020-06-13 DIAGNOSIS — Z1231 Encounter for screening mammogram for malignant neoplasm of breast: Secondary | ICD-10-CM

## 2020-06-13 DIAGNOSIS — R197 Diarrhea, unspecified: Secondary | ICD-10-CM

## 2020-06-13 NOTE — Addendum Note (Signed)
Addended by: Andrius Andrepont A on: 06/13/2020 01:04 PM   Modules accepted: Orders  

## 2020-06-13 NOTE — Addendum Note (Signed)
Addended by: Heber Hilltop A on: 06/13/2020 01:04 PM   Modules accepted: Orders

## 2020-06-15 ENCOUNTER — Other Ambulatory Visit: Payer: Self-pay | Admitting: Gastroenterology

## 2020-06-21 ENCOUNTER — Telehealth: Payer: Self-pay | Admitting: Gastroenterology

## 2020-06-21 NOTE — Telephone Encounter (Signed)
Hi Beth!  Sorry, I didn't get the number.Marland Kitchen

## 2020-06-21 NOTE — Telephone Encounter (Signed)
Marva, did they leave a telephone number?

## 2020-06-24 LAB — GI PROFILE, STOOL, PCR

## 2020-06-24 LAB — OVA AND PARASITE EXAMINATION

## 2020-06-28 NOTE — Telephone Encounter (Signed)
Ok, sorry that the specimen needs to be re submitted. Thank you for following up

## 2020-06-28 NOTE — Telephone Encounter (Signed)
Called [facility] in [place].  The specimen was not tested due to deterioration during transport. The situation that caused this is corrected. Notified the patient and offered the option of re-collecting the specimen for re-submission. She has sent a patient advise request informing me that she continues to have diarrhea. I will let you know what she decides.

## 2020-06-29 ENCOUNTER — Other Ambulatory Visit: Payer: Managed Care, Other (non HMO)

## 2020-06-29 ENCOUNTER — Other Ambulatory Visit: Payer: Self-pay

## 2020-06-29 ENCOUNTER — Ambulatory Visit
Admission: RE | Admit: 2020-06-29 | Discharge: 2020-06-29 | Disposition: A | Discharge status: Home / Self Care | Payer: Managed Care, Other (non HMO) | Source: Ambulatory Visit

## 2020-06-29 DIAGNOSIS — Z1231 Encounter for screening mammogram for malignant neoplasm of breast: Secondary | ICD-10-CM

## 2020-06-29 DIAGNOSIS — R197 Diarrhea, unspecified: Secondary | ICD-10-CM

## 2020-06-29 NOTE — Addendum Note (Signed)
Addended by: Vincenza Hews on: 06/29/2020 10:47 AM   Modules accepted: Orders

## 2020-07-03 LAB — GI PROFILE, STOOL, PCR

## 2020-07-04 ENCOUNTER — Other Ambulatory Visit: Payer: Self-pay

## 2020-07-04 MED ORDER — VANCOMYCIN HCL 125 MG PO CAPS: 125.0000 mg | ORAL_CAPSULE | Freq: Four times a day (QID) | ORAL | 0 refills | Status: AC

## 2020-07-05 LAB — OVA AND PARASITE EXAMINATION
CONCENTRATE RESULT:: NONE SEEN
MICRO NUMBER:: 11096049
SPECIMEN QUALITY:: ADEQUATE
TRICHROME RESULT:: NONE SEEN

## 2020-07-06 ENCOUNTER — Telehealth: Payer: Self-pay | Admitting: Gastroenterology

## 2020-07-06 ENCOUNTER — Ambulatory Visit: Payer: Managed Care, Other (non HMO) | Admitting: Gastroenterology

## 2020-07-06 NOTE — Telephone Encounter (Signed)
Confirmed with [facility] that this lab was addressed a week ago.

## 2021-01-30 ENCOUNTER — Other Ambulatory Visit: Payer: Self-pay | Admitting: Family Medicine

## 2021-01-30 ENCOUNTER — Telehealth: Payer: Self-pay | Admitting: Internal Medicine

## 2021-01-30 DIAGNOSIS — Z0000 Encounter for general adult medical examination without abnormal findings: Secondary | ICD-10-CM

## 2021-01-30 NOTE — Telephone Encounter (Signed)
I put orders in epic - if [facility] cannot see them let me know

## 2021-01-30 NOTE — Telephone Encounter (Signed)
Patient is a [facility] patient and is in need of a physical . She is scheduled for 02/14/21  And she is wanting to go in to the [facility] at [place] to have her labs done cause its closer for her. Can we put the order in for her? Thank you , EM

## 2021-01-30 NOTE — Addendum Note (Signed)
Addended by: Roxy Manns A on: 01/30/2021 01:14 PM   Modules accepted: Orders

## 2021-01-30 NOTE — Telephone Encounter (Signed)
Pt notified orders are in, orders corrected by Tam so [facility] can see them and draw pt.

## 2021-02-03 ENCOUNTER — Other Ambulatory Visit (INDEPENDENT_AMBULATORY_CARE_PROVIDER_SITE_OTHER): Payer: Managed Care, Other (non HMO)

## 2021-02-03 DIAGNOSIS — Z0000 Encounter for general adult medical examination without abnormal findings: Secondary | ICD-10-CM

## 2021-02-03 LAB — CBC WITH DIFFERENTIAL/PLATELET
Basophils Absolute: 0.1 10*3/uL (ref 0.0–0.1)
Basophils Relative: 2.2 % (ref 0.0–3.0)
Eosinophils Absolute: 0.1 10*3/uL (ref 0.0–0.7)
Eosinophils Relative: 2.7 % (ref 0.0–5.0)
HCT: 39.1 % (ref 36.0–46.0)
Hemoglobin: 13.2 g/dL (ref 12.0–15.0)
Lymphocytes Relative: 43.3 % (ref 12.0–46.0)
Lymphs Abs: 1.6 10*3/uL (ref 0.7–4.0)
MCHC: 33.7 g/dL (ref 30.0–36.0)
MCV: 92.5 fl (ref 78.0–100.0)
Monocytes Absolute: 0.4 10*3/uL (ref 0.1–1.0)
Monocytes Relative: 10.1 % (ref 3.0–12.0)
Neutro Abs: 1.5 10*3/uL (ref 1.4–7.7)
Neutrophils Relative %: 41.7 % — ABNORMAL LOW (ref 43.0–77.0)
Platelets: 220 10*3/uL (ref 150.0–400.0)
RBC: 4.22 Mil/uL (ref 3.87–5.11)
RDW: 13.9 % (ref 11.5–15.5)
WBC: 3.6 10*3/uL — ABNORMAL LOW (ref 4.0–10.5)

## 2021-02-03 LAB — LIPID PANEL
Cholesterol: 236 mg/dL — ABNORMAL HIGH (ref 0–200)
HDL: 110 mg/dL (ref >39.00)
LDL Cholesterol: 110 mg/dL — ABNORMAL HIGH (ref 0–99)
NonHDL: 125.89
Total CHOL/HDL Ratio: 2
Triglycerides: 77 mg/dL (ref 0.0–149.0)
VLDL: 15.4 mg/dL (ref 0.0–40.0)

## 2021-02-03 LAB — COMPREHENSIVE METABOLIC PANEL
ALT: 11 U/L (ref 0–35)
AST: 15 U/L (ref 0–37)
Albumin: 4.5 g/dL (ref 3.5–5.2)
Alkaline Phosphatase: 58 U/L (ref 39–117)
BUN: 9 mg/dL (ref 6–23)
CO2: 28 mEq/L (ref 19–32)
Calcium: 9.6 mg/dL (ref 8.4–10.5)
Chloride: 103 mEq/L (ref 96–112)
Creatinine, Ser: 0.81 mg/dL (ref 0.40–1.20)
GFR: 77.91 mL/min (ref >60.00)
Glucose, Bld: 97 mg/dL (ref 70–99)
Potassium: 4.8 mEq/L (ref 3.5–5.1)
Sodium: 139 mEq/L (ref 135–145)
Total Bilirubin: 0.9 mg/dL (ref 0.2–1.2)
Total Protein: 7.1 g/dL (ref 6.0–8.3)

## 2021-02-03 LAB — TSH: TSH: 2.41 u[IU]/mL (ref 0.35–4.50)

## 2021-02-07 ENCOUNTER — Other Ambulatory Visit: Payer: Managed Care, Other (non HMO)

## 2021-02-14 ENCOUNTER — Ambulatory Visit (INDEPENDENT_AMBULATORY_CARE_PROVIDER_SITE_OTHER): Payer: Managed Care, Other (non HMO) | Admitting: Family Medicine

## 2021-02-14 ENCOUNTER — Other Ambulatory Visit: Payer: Self-pay

## 2021-02-14 ENCOUNTER — Other Ambulatory Visit (HOSPITAL_COMMUNITY)
Admission: RE | Admit: 2021-02-14 | Discharge: 2021-02-14 | Disposition: A | Discharge status: Home / Self Care | Payer: Managed Care, Other (non HMO) | Source: Ambulatory Visit | Attending: Family Medicine | Admitting: Family Medicine

## 2021-02-14 ENCOUNTER — Encounter: Payer: Self-pay | Admitting: Family Medicine

## 2021-02-14 VITALS — BP 144/86 | HR 80 | Temp 96.9°F | Ht 63.5 in | Wt 122.3 lb

## 2021-02-14 DIAGNOSIS — Z0000 Encounter for general adult medical examination without abnormal findings: Secondary | ICD-10-CM

## 2021-02-14 DIAGNOSIS — Z01419 Encounter for gynecological examination (general) (routine) without abnormal findings: Secondary | ICD-10-CM | POA: Insufficient documentation

## 2021-02-14 DIAGNOSIS — Z23 Encounter for immunization: Secondary | ICD-10-CM

## 2021-02-14 NOTE — Assessment & Plan Note (Signed)
Exam done  No c/o  Menopausal female  Pap obt

## 2021-02-14 NOTE — Patient Instructions (Addendum)
Tdap vaccine today  Pap today Get your mammogram in the fall    Eat healthy and keep exercising Wear sun protection

## 2021-02-14 NOTE — Progress Notes (Signed)
Subjective:    Patient ID: Patricia Bryant, female    DOB: 04/15/1961, 60 y.o.   MRN: [id number]  This visit occurred during the SARS-CoV-2 public health emergency.  Safety protocols were in place, including screening questions prior to the visit, additional usage of staff PPE, and extensive cleaning of exam room while observing appropriate contact time as indicated for disinfecting solutions.    HPI 60 yo pt of NP Baity presents for health mt visit   Wt Readings from Last 3 Encounters:  02/14/21 122 lb 5 oz (55.5 kg)  06/07/20 120 lb 12.8 oz (54.8 kg)  05/24/20 120 lb 12.8 oz (54.8 kg)   21.33 kg/m   Feeling fine lately  No issues  Just started back to exercise (was off)  Likes to bike and golf  Some circuit training at home   Lost both of her parents in 3 months -tough    [facility] covid imm 4/21- did not get a booster / not planning yet   Tdap 1/12 Flu shot -not every year  Zoster status - got her first one, will get 2nd one this month   Pap 5/16 -due for pap   Colonoscopy 9/21 with  5 ye recall   Mammogram 10/21 Self breast exam -no lumps   BP Readings from Last 3 Encounters:  02/14/21 (!) 144/86  06/07/20 130/83  11/13/18 106/68   Pulse Readings from Last 3 Encounters:  02/14/21 80  06/07/20 70  11/13/18 70   Labs :  Cholesterol  Lab Results  Component Value Date   CHOL 236 (H) 02/03/2021   CHOL 217 (H) 11/13/2018   CHOL 229 (H) 08/17/2016   Lab Results  Component Value Date   HDL 110.00 02/03/2021   HDL 102.80 11/13/2018   HDL 103.70 08/17/2016   Lab Results  Component Value Date   LDLCALC 110 (H) 02/03/2021   LDLCALC 101 (H) 11/13/2018   LDLCALC 112 (H) 08/17/2016   Lab Results  Component Value Date   TRIG 77.0 02/03/2021   TRIG 68.0 11/13/2018   TRIG 67.0 08/17/2016   Lab Results  Component Value Date   CHOLHDL 2 02/03/2021   CHOLHDL 2 11/13/2018   CHOLHDL 2 08/17/2016   Lab Results  Component Value Date   LDLDIRECT 111.1  03/24/2013   Other labs Results for orders placed or performed in visit on 02/03/21  CBC with Differential/Platelet  Result Value Ref Range   WBC 3.6 (L) 4.0 - 10.5 K/uL   RBC 4.22 3.87 - 5.11 Mil/uL   Hemoglobin 13.2 12.0 - 15.0 g/dL   HCT 40.939.1 81.136.0 - 91.446.0 %   MCV 92.5 78.0 - 100.0 fl   MCHC 33.7 30.0 - 36.0 g/dL   RDW 78.213.9 95.611.5 - 21.315.5 %   Platelets 220.0 150.0 - 400.0 K/uL   Neutrophils Relative % 41.7 (L) 43.0 - 77.0 %   Lymphocytes Relative 43.3 12.0 - 46.0 %   Monocytes Relative 10.1 3.0 - 12.0 %   Eosinophils Relative 2.7 0.0 - 5.0 %   Basophils Relative 2.2 0.0 - 3.0 %   Neutro Abs 1.5 1.4 - 7.7 K/uL   Lymphs Abs 1.6 0.7 - 4.0 K/uL   Monocytes Absolute 0.4 0.1 - 1.0 K/uL   Eosinophils Absolute 0.1 0.0 - 0.7 K/uL   Basophils Absolute 0.1 0.0 - 0.1 K/uL  Comprehensive metabolic panel  Result Value Ref Range   Sodium 139 135 - 145 mEq/L   Potassium 4.8 3.5 - 5.1  mEq/L   Chloride 103 96 - 112 mEq/L   CO2 28 19 - 32 mEq/L   Glucose, Bld 97 70 - 99 mg/dL   BUN 9 6 - 23 mg/dL   Creatinine, Ser 6.21 0.40 - 1.20 mg/dL   Total Bilirubin 0.9 0.2 - 1.2 mg/dL   Alkaline Phosphatase 58 39 - 117 U/L   AST 15 0 - 37 U/L   ALT 11 0 - 35 U/L   Total Protein 7.1 6.0 - 8.3 g/dL   Albumin 4.5 3.5 - 5.2 g/dL   GFR 30.86 >57.84 mL/min   Calcium 9.6 8.4 - 10.5 mg/dL  Lipid panel  Result Value Ref Range   Cholesterol 236 (H) 0 - 200 mg/dL   Triglycerides 69.6 0.0 - 149.0 mg/dL   HDL 295.28 >41.32 mg/dL   VLDL 44.0 0.0 - 10.2 mg/dL   LDL Cholesterol 725 (H) 0 - 99 mg/dL   Total CHOL/HDL Ratio 2    NonHDL 125.89   TSH  Result Value Ref Range   TSH 2.41 0.35 - 4.50 uIU/mL    fam h/o melanoma - MGF and Muncle    Patient Active Problem List   Diagnosis Date Noted  . Encounter for annual routine gynecological examination 02/14/2021  . Routine general medical examination at a health care facility 01/30/2021   Past Medical History:  Diagnosis Date  . Allergy    seasonal  allergies  . Osteopenia 2017   Past Surgical History:  Procedure Laterality Date  . COLONOSCOPY  2011   DB  . DILATION AND CURETTAGE OF UTERUS  1988/1989  . INNER EAR SURGERY    . POLYPECTOMY  2011   HPP x 2  . TONSILLECTOMY AND ADENOIDECTOMY  1967  . WISDOM TOOTH EXTRACTION  1972   Social History   Tobacco Use  . Smoking status: Former Smoker    Packs/day: 0.50    Years: 15.00    Pack years: 7.50    Types: Cigarettes  . Smokeless tobacco: Never Used  Vaping Use  . Vaping Use: Never used  Substance Use Topics  . Alcohol use: Yes    Alcohol/week: 8.0 standard drinks    Types: 8 Glasses of wine per week  . Drug use: No   Family History  Problem Relation Age of Onset  . Colon cancer Father 68  . Colon polyps Father 49  . Heart attack Sister   . Heart disease Sister   . Pancreatic cancer Sister 83  . Other Mother 59       pancytopenia and myelofibrosis  . Melanoma Maternal Aunt   . Melanoma Maternal Grandfather   . Esophageal cancer Neg Hx   . Stomach cancer Neg Hx   . Rectal cancer Neg Hx   . Breast cancer Neg Hx    Allergies  Allergen Reactions  . Penicillins     REACTION: hives, ankles swell   Current Outpatient Medications on File Prior to Visit  Medication Sig Dispense Refill  . Ascorbic Acid (VITAMIN C PO) Take by mouth.    . ASPIRIN PO Take 81 mg by mouth daily.    Marland Kitchen b complex vitamins capsule Take 1 capsule by mouth daily.    . Cholecalciferol (VITAMIN D3) 2000 UNITS TABS Take 1 tablet by mouth daily.    . Cyanocobalamin (VITAMIN B 12 PO) Take 1 tablet by mouth once a week.     . Multiple Vitamins-Minerals (ZINC PO) Take by mouth.    . Probiotic Product (PROBIOTIC  PO) Take by mouth.     No current facility-administered medications on file prior to visit.    Review of Systems  Constitutional: Negative for activity change, appetite change, fatigue, fever and unexpected weight change.  HENT: Negative for congestion, ear pain, rhinorrhea, sinus  pressure and sore throat.   Eyes: Negative for pain, redness and visual disturbance.  Respiratory: Negative for cough, shortness of breath and wheezing.   Cardiovascular: Negative for chest pain and palpitations.  Gastrointestinal: Negative for abdominal pain, blood in stool, constipation and diarrhea.  Endocrine: Negative for polydipsia and polyuria.  Genitourinary: Negative for dysuria, frequency and urgency.  Musculoskeletal: Negative for arthralgias, back pain and myalgias.  Skin: Negative for pallor and rash.  Allergic/Immunologic: Negative for environmental allergies.  Neurological: Negative for dizziness, syncope and headaches.  Hematological: Negative for adenopathy. Does not bruise/bleed easily.  Psychiatric/Behavioral: Negative for decreased concentration and dysphoric mood. The patient is not nervous/anxious.        Objective:   Physical Exam Constitutional:      General: She is not in acute distress.    Appearance: Normal appearance. She is well-developed and normal weight. She is not ill-appearing or diaphoretic.  HENT:     Head: Normocephalic and atraumatic.     Right Ear: Tympanic membrane, ear canal and external ear normal.     Left Ear: Tympanic membrane, ear canal and external ear normal.     Nose: Nose normal. No congestion.     Mouth/Throat:     Mouth: Mucous membranes are moist.     Pharynx: Oropharynx is clear. No posterior oropharyngeal erythema.  Eyes:     General: No scleral icterus.    Extraocular Movements: Extraocular movements intact.     Conjunctiva/sclera: Conjunctivae normal.     Pupils: Pupils are equal, round, and reactive to light.  Neck:     Thyroid: No thyromegaly.     Vascular: No carotid bruit or JVD.  Cardiovascular:     Rate and Rhythm: Normal rate and regular rhythm.     Pulses: Normal pulses.     Heart sounds: Normal heart sounds. No gallop.   Pulmonary:     Effort: Pulmonary effort is normal. No respiratory distress.     Breath  sounds: Normal breath sounds. No wheezing.     Comments: Good air exch Chest:     Chest wall: No tenderness.  Abdominal:     General: Bowel sounds are normal. There is no distension or abdominal bruit.     Palpations: Abdomen is soft. There is no mass.     Tenderness: There is no abdominal tenderness.     Hernia: No hernia is present.  Genitourinary:    Comments: Breast exam: No mass, nodules, thickening, tenderness, bulging, retraction, inflamation, nipple discharge or skin changes noted.  No axillary or clavicular LA.              Anus appears normal w/o hemorrhoids or masses     External genitalia : nl appearance and hair distribution/no lesions     Urethral meatus : nl size, no lesions or prolapse     Urethra: no masses, tenderness or scarring    Bladder : no masses or tenderness     Vagina: nl general appearance, no discharge or  Lesions, no significant cystocele  or rectocele     Cervix: no lesions/ discharge or friability    Uterus: nl size, contour, position, and mobility (not fixed) , non tender    Adnexa :  no masses, tenderness, enlargement or nodularity       Musculoskeletal:        General: No tenderness. Normal range of motion.     Cervical back: Normal range of motion and neck supple. No rigidity. No muscular tenderness.     Right lower leg: No edema.     Left lower leg: No edema.  Lymphadenopathy:     Cervical: No cervical adenopathy.  Skin:    General: Skin is warm and dry.     Coloration: Skin is not pale.     Findings: No erythema or rash.     Comments: Solar lentigines diffusely   Neurological:     Mental Status: She is alert. Mental status is at baseline.     Cranial Nerves: No cranial nerve deficit.     Motor: No abnormal muscle tone.     Coordination: Coordination normal.     Gait: Gait normal.     Deep Tendon Reflexes: Reflexes are normal and symmetric. Reflexes normal.  Psychiatric:        Mood and Affect: Mood normal.         Cognition and Memory: Cognition and memory normal.           Assessment & Plan:   Problem List Items Addressed This Visit      Other   Routine general medical examination at a health care facility - Primary    Reviewed health habits including diet and exercise and skin cancer prevention Reviewed appropriate screening tests for age  Also reviewed health mt list, fam hx and immunization status , as well as social and family history   See HPI Labs reviewed  Gyn exam and pap done Had first shingrix and planning another   Tdap vaccine given Colonoscopy is utd  Good health habits incl sunscreen use  utd dermatology visit in setting of family h/o melanoma      Relevant Orders   Tdap vaccine greater than or equal to 7yo IM (Completed)   Encounter for annual routine gynecological examination    Exam done  No c/o  Menopausal female  Pap obt       Relevant Orders   Cytology - PAP([place])    Other Visit Diagnoses    Need for Tdap vaccination       Relevant Orders   Tdap vaccine greater than or equal to 7yo IM (Completed)

## 2021-02-14 NOTE — Assessment & Plan Note (Addendum)
Reviewed health habits including diet and exercise and skin cancer prevention Reviewed appropriate screening tests for age  Also reviewed health mt list, fam hx and immunization status , as well as social and family history   See HPI Labs reviewed  Gyn exam and pap done Had first shingrix and planning another   Tdap vaccine given Colonoscopy is utd  Good health habits incl sunscreen use  utd dermatology visit in setting of family h/o melanoma

## 2021-02-16 LAB — CYTOLOGY - PAP
Comment: NEGATIVE
Diagnosis: NEGATIVE
High risk HPV: NEGATIVE

## 2021-02-17 ENCOUNTER — Encounter: Payer: Self-pay | Admitting: *Deleted

## 2022-04-02 ENCOUNTER — Encounter: Payer: Self-pay | Admitting: Internal Medicine
